# Patient Record
Sex: Female | Born: 1965 | Race: White | Hispanic: No | Marital: Married | Smoking: Former smoker
Health system: Southern US, Community
[De-identification: ages and names within clinical notes are randomized; demographics above are authoritative.]

## PROBLEM LIST (undated history)

## (undated) DIAGNOSIS — E785 Hyperlipidemia, unspecified: Secondary | ICD-10-CM

## (undated) DIAGNOSIS — K219 Gastro-esophageal reflux disease without esophagitis: Secondary | ICD-10-CM

## (undated) DIAGNOSIS — I219 Acute myocardial infarction, unspecified: Secondary | ICD-10-CM

## (undated) DIAGNOSIS — I2510 Atherosclerotic heart disease of native coronary artery without angina pectoris: Secondary | ICD-10-CM

## (undated) DIAGNOSIS — R202 Paresthesia of skin: Secondary | ICD-10-CM

## (undated) DIAGNOSIS — R200 Anesthesia of skin: Secondary | ICD-10-CM

## (undated) DIAGNOSIS — M1990 Unspecified osteoarthritis, unspecified site: Secondary | ICD-10-CM

## (undated) DIAGNOSIS — T7840XA Allergy, unspecified, initial encounter: Secondary | ICD-10-CM

## (undated) DIAGNOSIS — K5792 Diverticulitis of intestine, part unspecified, without perforation or abscess without bleeding: Secondary | ICD-10-CM

## (undated) HISTORY — DX: Unspecified osteoarthritis, unspecified site: M19.90

## (undated) HISTORY — PX: CARDIAC CATHETERIZATION: SHX172

## (undated) HISTORY — DX: Hyperlipidemia, unspecified: E78.5

## (undated) HISTORY — PX: CERVICAL DISCECTOMY: SHX98

## (undated) HISTORY — DX: Gastro-esophageal reflux disease without esophagitis: K21.9

## (undated) HISTORY — DX: Allergy, unspecified, initial encounter: T78.40XA

## (undated) HISTORY — PX: FRACTURE SURGERY: SHX138

## (undated) HISTORY — DX: Paresthesia of skin: R20.2

## (undated) HISTORY — PX: MOLE REMOVAL: SHX2046

## (undated) HISTORY — DX: Anesthesia of skin: R20.0

## (undated) HISTORY — DX: Atherosclerotic heart disease of native coronary artery without angina pectoris: I25.10

## (undated) HISTORY — PX: OTHER SURGICAL HISTORY: SHX169

## (undated) HISTORY — DX: Diverticulitis of intestine, part unspecified, without perforation or abscess without bleeding: K57.92

## (undated) HISTORY — PX: SPINE SURGERY: SHX786

## (undated) HISTORY — PX: JOINT REPLACEMENT: SHX530

---
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
## 1997-10-12 HISTORY — PX: TUBAL LIGATION: SHX77

## 1997-10-12 HISTORY — PX: CHOLECYSTECTOMY: SHX55

## 2000-03-07 ENCOUNTER — Emergency Department (HOSPITAL_COMMUNITY): Admission: EM | Admit: 2000-03-07 | Discharge: 2000-03-08 | Payer: Self-pay | Admitting: Emergency Medicine

## 2000-08-16 ENCOUNTER — Encounter: Admission: RE | Admit: 2000-08-16 | Discharge: 2000-08-16 | Payer: Self-pay | Admitting: Family Medicine

## 2000-08-16 ENCOUNTER — Encounter: Payer: Self-pay | Admitting: Family Medicine

## 2007-08-29 ENCOUNTER — Encounter: Admission: RE | Admit: 2007-08-29 | Discharge: 2007-08-29 | Payer: Self-pay | Admitting: Gastroenterology

## 2010-02-22 ENCOUNTER — Encounter: Payer: Self-pay | Admitting: Cardiovascular Disease

## 2010-02-22 ENCOUNTER — Ambulatory Visit: Payer: Self-pay | Admitting: Internal Medicine

## 2010-02-22 ENCOUNTER — Inpatient Hospital Stay (HOSPITAL_COMMUNITY): Admission: EM | Admit: 2010-02-22 | Discharge: 2010-02-25 | Payer: Self-pay | Admitting: Internal Medicine

## 2010-02-22 ENCOUNTER — Encounter: Payer: Self-pay | Admitting: Emergency Medicine

## 2010-02-26 ENCOUNTER — Telehealth: Payer: Self-pay | Admitting: Cardiovascular Disease

## 2010-03-11 ENCOUNTER — Ambulatory Visit: Payer: Self-pay | Admitting: Cardiovascular Disease

## 2010-03-11 DIAGNOSIS — I214 Non-ST elevation (NSTEMI) myocardial infarction: Secondary | ICD-10-CM

## 2010-03-11 DIAGNOSIS — E7800 Pure hypercholesterolemia, unspecified: Secondary | ICD-10-CM

## 2010-03-19 ENCOUNTER — Encounter: Payer: Self-pay | Admitting: Cardiovascular Disease

## 2010-03-20 ENCOUNTER — Encounter (HOSPITAL_COMMUNITY): Admission: RE | Admit: 2010-03-20 | Discharge: 2010-06-12 | Payer: Self-pay | Admitting: Cardiovascular Disease

## 2010-03-25 ENCOUNTER — Encounter: Payer: Self-pay | Admitting: Cardiovascular Disease

## 2010-04-04 ENCOUNTER — Ambulatory Visit: Payer: Self-pay | Admitting: Cardiovascular Disease

## 2010-04-06 LAB — CONVERTED CEMR LAB
ALT: 22 units/L (ref 0–35)
AST: 27 units/L (ref 0–37)
Albumin: 3.8 g/dL (ref 3.5–5.2)
Alkaline Phosphatase: 72 units/L (ref 39–117)
BUN: 6 mg/dL (ref 6–23)
Basophils Absolute: 0.1 10*3/uL (ref 0.0–0.1)
Basophils Relative: 0.6 % (ref 0.0–3.0)
Bilirubin, Direct: 0.2 mg/dL (ref 0.0–0.3)
CO2: 29 meq/L (ref 19–32)
Calcium: 9.1 mg/dL (ref 8.4–10.5)
Chloride: 107 meq/L (ref 96–112)
Cholesterol: 125 mg/dL (ref 0–200)
Creatinine, Ser: 0.6 mg/dL (ref 0.4–1.2)
Eosinophils Absolute: 0.1 10*3/uL (ref 0.0–0.7)
Eosinophils Relative: 0.6 % (ref 0.0–5.0)
GFR calc non Af Amer: 124.73 mL/min (ref >60)
Glucose, Bld: 85 mg/dL (ref 70–99)
HCT: 36.3 % (ref 36.0–46.0)
HDL: 43.8 mg/dL (ref >39.00)
Hemoglobin: 12.3 g/dL (ref 12.0–15.0)
LDL Cholesterol: 62 mg/dL (ref 0–99)
Lymphocytes Relative: 22.1 % (ref 12.0–46.0)
Lymphs Abs: 2 10*3/uL (ref 0.7–4.0)
MCHC: 33.8 g/dL (ref 30.0–36.0)
MCV: 91.3 fL (ref 78.0–100.0)
Monocytes Absolute: 0.7 10*3/uL (ref 0.1–1.0)
Monocytes Relative: 7.9 % (ref 3.0–12.0)
Neutro Abs: 6.2 10*3/uL (ref 1.4–7.7)
Neutrophils Relative %: 68.8 % (ref 43.0–77.0)
Platelets: 320 10*3/uL (ref 150.0–400.0)
Potassium: 4.4 meq/L (ref 3.5–5.1)
RBC: 3.98 M/uL (ref 3.87–5.11)
RDW: 12.9 % (ref 11.5–14.6)
Sodium: 140 meq/L (ref 135–145)
Total Bilirubin: 0.5 mg/dL (ref 0.3–1.2)
Total CHOL/HDL Ratio: 3
Total Protein: 6.7 g/dL (ref 6.0–8.3)
Triglycerides: 98 mg/dL (ref 0.0–149.0)
VLDL: 19.6 mg/dL (ref 0.0–40.0)
WBC: 9 10*3/uL (ref 4.5–10.5)

## 2010-04-11 ENCOUNTER — Ambulatory Visit: Payer: Self-pay | Admitting: Cardiovascular Disease

## 2010-04-29 ENCOUNTER — Telehealth: Payer: Self-pay | Admitting: Cardiovascular Disease

## 2010-06-02 ENCOUNTER — Encounter: Payer: Self-pay | Admitting: Cardiovascular Disease

## 2010-09-15 ENCOUNTER — Encounter
Admission: RE | Admit: 2010-09-15 | Discharge: 2010-09-15 | Payer: Self-pay | Source: Home / Self Care | Attending: Family Medicine | Admitting: Family Medicine

## 2010-10-01 ENCOUNTER — Ambulatory Visit: Payer: Self-pay | Admitting: Cardiovascular Disease

## 2010-10-01 DIAGNOSIS — I2510 Atherosclerotic heart disease of native coronary artery without angina pectoris: Secondary | ICD-10-CM

## 2010-11-11 NOTE — Assessment & Plan Note (Signed)
Summary: EPH   Visit Type:  Post-hospital Primary Provider:  none  CC:  No complains.  History of Present Illness: This is a 45 year-old woman presenting for hospital follow-up after presenting with a NSTEMI earlier this month. She underwent cardiac cath demonstrating critical prox LAD stenosis and was treated with a drug-eluting stent. There was a question of left main disease but IVUS demonstrated only mild atherosclerosis of the left mainstem.  She is doing well at present. Reports episodic, fleeting chest pains. These have occurred only at rest and are unlike the symptoms of her ACS. No dyspnea, edema, groin pain, or lightheadedness. She does complain of fatigue/drowsiness and wonders if the beta blocker is causing this. She is scheduled to start cardiac rehab June 8th.      Current Medications (verified): 1)  Fish Oil 1000 Mg Caps (Omega-3 Fatty Acids) .... Take 1 Capsule By Mouth Once A Day 2)  Tylenol 8 Hour 650 Mg Cr-Tabs (Acetaminophen) .... As Needed 3)  Metoprolol Tartrate 25 Mg Tabs (Metoprolol Tartrate) .... Take 1/2 Tablet Two Times A Day 4)  Lysine Acetate 500 Mg Tabs (Lysine Acetate) .... Take 1 Tablet By Mouth Once A Day 5)  Glucosamine 500 Mg Caps (Glucosamine Sulfate) .... Take 1 Capsule By Mouth Once A Day 6)  Aspirin Ec 325 Mg Tbec (Aspirin) .... Take One Tablet By Mouth Daily 7)  Nitrostat 0.4 Mg Subl (Nitroglycerin) .Marland Kitchen.. 1 Tablet Under Tongue At Onset of Chest Pain; You May Repeat Every 5 Minutes For Up To 3 Doses. 8)  Crestor 20 Mg Tabs (Rosuvastatin Calcium) .... Take One Tablet By Mouth Daily. 9)  Plavix 75 Mg Tabs (Clopidogrel Bisulfate) .... Take One Tablet By Mouth Daily  Allergies (verified): 1)  ! Penicillin 2)  ! Sulfa 3)  ! * Tape 4)  ! * Latex  Past History:  Past medical history reviewed for relevance to current acute and chronic problems.  Past Medical History:  Non-ST segment elevation myocardial  infarction 2011, DES placed in the LAD.    Marland KitchenHistory of diverticulitis.   Dyslipidemia   Gastroesophageal reflux disease.      Review of Systems       Negative except as per HPI   Vital Signs:  Patient profile:   45 year old female Height:      67 inches Weight:      172 pounds BMI:     27.04 Pulse rate:   65 / minute Pulse rhythm:   regular Resp:     18 per minute BP sitting:   118 / 72  (left arm) Cuff size:   large  Vitals Entered By: Vikki Ports (Mar 11, 2010 2:02 PM)  Physical Exam  General:  Pt is alert and oriented, age-apprpriate woman, in no acute distress. HEENT: normal Neck: normal carotid upstrokes without bruits, JVP normal Lungs: CTA CV: RRR without murmur or gallop Abd: soft, NT, positive BS, no bruit, no organomegaly Ext: no clubbing, cyanosis, or edema. peripheral pulses 2+ and equal Skin: warm and dry without rash    EKG  Procedure date:  03/11/2010  Findings:      NSR with T wave abnormality consider anterior ischemia, HR 65 bpm.  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 She is having no recurrent angina. Recommend continuation of her current medical program and initiation of Phase 2 cardiac rehab. She was counseled on side-effects of  beta blockers and other cardiac meds, and I asked her to stick with it until her follow-up visit in one month. Will reassess at that point and if fatigue is severe, will probably d/c the beta blocker to see if things improve.  Her updated medication list for this problem includes:    Metoprolol Tartrate 25 Mg Tabs (Metoprolol tartrate) .Marland Kitchen... Take 1/2 tablet two times a day    Aspirin Ec 325 Mg Tbec (Aspirin) .Marland Kitchen... Take one tablet by mouth daily    Nitrostat 0.4 Mg Subl (Nitroglycerin) .Marland Kitchen... 1 tablet under tongue at onset of chest pain; you may repeat every 5 minutes for up to 3 doses.    Plavix 75 Mg Tabs (Clopidogrel bisulfate) .Marland Kitchen... Take one tablet  by mouth daily  Orders: EKG w/ Interpretation (93000)  Problem # 2:  HYPERCHOLESTEROLEMIA (ICD-272.0) Started on rosuvastatin during index hospitalization. Follow-up lipids at time of next visit.  Her updated medication list for this problem includes:    Crestor 20 Mg Tabs (Rosuvastatin calcium) .Marland Kitchen... Take one tablet by mouth daily.  Orders: EKG w/ Interpretation (93000)  Patient Instructions: 1)  Your physician recommends that you schedule a follow-up appointment in: 1 MONTH 2)  Your physician recommends that you return for a FASTING LIPID, LIVER, BMP, and CBC (410.72, 272.0) in 1 MONTH--Nothing to eat or drink after midnight 3)  Your physician recommends that you continue on your current medications as directed. Please refer to the Current Medication list given to you today.

## 2010-11-11 NOTE — Assessment & Plan Note (Signed)
Summary: F1M   Visit Type:  1 month follow up Primary Provider:  none  CC:  No complaints.  History of Present Illness: This is a 45 year-old woman presenting for follow-up after presenting with a NSTEMI in May 2011. She underwent cardiac cath demonstrating critical prox LAD stenosis and was treated with a drug-eluting stent. The patient is doing very well. She is participating in cardiac rehab and riding her bike every day. She feels well and has no exertional chest pain or dyspnea. No edema or other complaints. She has adjusted to the beta-blocker and feels energy level is better.     Current Medications (verified): 1)  Fish Oil 1000 Mg Caps (Omega-3 Fatty Acids) .... Take 1 Capsule By Mouth Once A Day 2)  Tylenol 8 Hour 650 Mg Cr-Tabs (Acetaminophen) .... As Needed 3)  Metoprolol Tartrate 25 Mg Tabs (Metoprolol Tartrate) .... Take 1/2 Tablet Two Times A Day 4)  Lysine Acetate 500 Mg Tabs (Lysine Acetate) .... Take 1 Tablet By Mouth Once A Day 5)  Glucosamine 500 Mg Caps (Glucosamine Sulfate) .... Take 1 Capsule By Mouth Once A Day 6)  Aspirin Ec 325 Mg Tbec (Aspirin) .... Take One Tablet By Mouth Daily 7)  Nitrostat 0.4 Mg Subl (Nitroglycerin) .Marland Kitchen.. 1 Tablet Under Tongue At Onset of Chest Pain; You May Repeat Every 5 Minutes For Up To 3 Doses. 8)  Crestor 20 Mg Tabs (Rosuvastatin Calcium) .... Take One Tablet By Mouth Daily. 9)  Plavix 75 Mg Tabs (Clopidogrel Bisulfate) .... Take One Tablet By Mouth Daily  Allergies: 1)  ! Penicillin 2)  ! Sulfa 3)  ! * Tape 4)  ! * Latex  Past History:  Past medical history reviewed for relevance to current acute and chronic problems.  Past Medical History: Reviewed history from 03/11/2010 and no changes required.  Non-ST segment elevation myocardial  infarction 2011, DES placed in the LAD.  Marland KitchenHistory of diverticulitis.   Dyslipidemia   Gastroesophageal reflux disease.      Vital Signs:  Patient profile:   45 year old female Height:       67 inches Weight:      171 pounds BMI:     26.88 Pulse rate:   60 / minute Pulse rhythm:   regular Resp:     18 per minute BP sitting:   100 / 70  (left arm) Cuff size:   large  Vitals Entered By: Vikki Ports (April 11, 2010 9:49 AM)  Physical Exam  General:  Pt is alert and oriented, in no acute distress. HEENT: normal Neck: normal carotid upstrokes without bruits, JVP normal Lungs: CTA CV: RRR without murmur or gallop Abd: soft, NT, positive BS, no bruit, no organomegaly Ext: no clubbing, cyanosis, or edema. 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 I asked her to reduce her ASA to 81 mg daily beginning in mid-August (3 months from stenting). She should continue with long-term plavix for at least one year. Otherwise continue current med Rx.  Her updated medication list for this problem includes:    Metoprolol Tartrate 25 Mg Tabs (Metoprolol tartrate) .Marland Kitchen... Take 1/2 tablet two times a day    Aspirin Ec 325 Mg Tbec (Aspirin) .Marland Kitchen... Take one tablet by mouth daily    Nitrostat 0.4 Mg Subl (Nitroglycerin) .Marland Kitchen... 1 tablet under tongue  at onset of chest pain; you may repeat every 5 minutes for up to 3 doses.    Plavix 75 Mg Tabs (Clopidogrel bisulfate) .Marland Kitchen... Take one tablet by mouth daily  Problem # 2:  HYPERCHOLESTEROLEMIA (ICD-272.0) Lipids at goal. Will f/u lipids and lft's in 6 months.  Her updated medication list for this problem includes:    Crestor 20 Mg Tabs (Rosuvastatin calcium) .Marland Kitchen... Take one tablet by mouth daily.  CHOL: 125 (04/04/2010)   LDL: 62 (04/04/2010)   HDL: 43.80 (04/04/2010)   TG: 98.0 (04/04/2010)  Patient Instructions: 1)  Your physician recommends that you continue on your current medications as directed. Please refer to the Current Medication list given to you today. 2)  Your physician wants you  to follow-up in: November.  You will receive a reminder letter in the mail two months in advance. If you don't receive a letter, please call our office to schedule the follow-up appointment. 3)  You may decrease your Aspirin to 81mg  once a day in mid-August.  4)  Your physician recommends that you return for a FASTING LIPID and LIVER Profile in December.

## 2010-11-11 NOTE — Letter (Signed)
[facility]   Imported By: Marylou Mccoy 06/13/2010 16:42:49  _____________________________________________________________________  External Attachment:    Type:   Image     Comment:   External Document

## 2010-11-11 NOTE — Miscellaneous (Signed)
Summary: [facility]  [facility]   Imported By: Roderic Ovens 04/02/2010 16:18:34  _____________________________________________________________________  External Attachment:    Type:   Image     Comment:   External Document

## 2010-11-11 NOTE — Progress Notes (Signed)
Summary: pt would like to graduate from cardiac rehab  Phone Note Call from Patient   Caller: Patient Reason for Call: Talk to Nurse Summary of Call: pt would like to graduate from cardiac rehab  on 7-29 if at all posssible-pls call [phone] Initial call taken by: Glynda Jaeger,  April 29, 2010 2:42 PM  Follow-up for Phone Call        Spoke with pt. Patient would like to know if she can be d/c from cardiac rehab. on 05/09/10. Pt. states she is doing well. I let pt. know. I will send this message to  Dr. Excell Seltzer. He  will be in office sometime next week. Okay with pt.   Follow-up by: Ollen Gross, RN, BSN,  April 29, 2010 2:48 PM  Additional Follow-up for Phone Call Additional follow up Details #1::        this is fine Additional Follow-up by: Norva Karvonen, MD,  April 30, 2010 11:13 PM     Appended Document: pt would like to graduate from cardiac rehab Called patient and LM that it is OK with Dr.Chrisie Jankovich that she DC Cardiac Rehab. Called [facility] and advised as above and they can send [place] an order to sign if they need [facility] to do this.

## 2010-11-11 NOTE — Miscellaneous (Signed)
Summary: [facility]  [facility]   Imported By: Roderic Ovens 04/05/2010 09:57:52  _____________________________________________________________________  External Attachment:    Type:   Image     Comment:   External Document

## 2010-11-11 NOTE — Miscellaneous (Signed)
Summary: [facility]   [facility]   Imported By: Roderic Ovens 04/22/2010 15:07:07  _____________________________________________________________________  External Attachment:    Type:   Image     Comment:   External Document

## 2010-11-11 NOTE — Progress Notes (Signed)
Summary: Hep B Injection  Phone Note Call from Patient Call back at Home Phone [phone]   Caller: Patient Reason for Call: Talk to Nurse Summary of Call: pt due for last Hep B shot is this ok?  had stent placed on Monday Initial call taken by: Migdalia Dk,  Feb 26, 2010 4:05 PM  Follow-up for Phone Call        I spoke with the pt and told her that this vaccine should not interfere with her stent.  The pt asked if the vaccine would interfere with her medications.  I told the pt that this is unknown because research has not been done on how vaccine interferes with meds.  The pt is scheduled for vaccine on Friday at Urgent Care. Follow-up by: Julieta Gutting, RN, BSN,  Feb 26, 2010 4:15 PM

## 2010-11-11 NOTE — Miscellaneous (Signed)
Summary: [facility]  [facility]   Imported By: Roderic Ovens 04/08/2010 16:21:01  _____________________________________________________________________  External Attachment:    Type:   Image     Comment:   External Document

## 2010-11-13 NOTE — Assessment & Plan Note (Signed)
Summary: f51m   Visit Type:  Follow-up Primary Provider:  [facility]  CC:  none.  History of Present Illness: This is a 45 year-old woman presenting for follow-up after presenting with a NSTEMI in May 2011. She underwent cardiac cath demonstrating critical prox LAD stenosis and was treated with a drug-eluting stent.   She continues to do well without complaints of chest pain, dyspnea, or edema. No lightheadedness, palps, or syncope. She has not been exercising regularly. She reports compliance with her medications.     Current Medications (verified): 1)  Fish Oil 1000 Mg Caps (Omega-3 Fatty Acids) .... Take 1 Capsule By Mouth Once A Day 2)  Tylenol 8 Hour 650 Mg Cr-Tabs (Acetaminophen) .... As Needed 3)  Metoprolol Tartrate 25 Mg Tabs (Metoprolol Tartrate) .... Take 1/2 Tablet Two Times A Day 4)  Lysine Acetate 500 Mg Tabs (Lysine Acetate) .... Take 1 Tablet By Mouth Once A Day 5)  Glucosamine 500 Mg Caps (Glucosamine Sulfate) .... Take 1 Capsule By Mouth Once A Day 6)  Aspirin 81 Mg  Tabs (Aspirin) .Marland Kitchen.. 1 By Mouth Daily 7)  Nitrostat 0.4 Mg Subl (Nitroglycerin) .Marland Kitchen.. 1 Tablet Under Tongue At Onset of Chest Pain; You May Repeat Every 5 Minutes For Up To 3 Doses. 8)  Crestor 20 Mg Tabs (Rosuvastatin Calcium) .... Take One Tablet By Mouth Daily. 9)  Plavix 75 Mg Tabs (Clopidogrel Bisulfate) .... Take One Tablet By Mouth Daily 10)  Multivitamins   Tabs (Multiple Vitamin) .Marland Kitchen.. 1 By Mouth Daily 11)  Omeprazole 20 Mg Cpdr (Omeprazole) .Marland Kitchen.. 1 By Mouth Daily  Allergies (verified): 1)  ! Penicillin 2)  ! Sulfa 3)  ! * Tape 4)  ! * Latex  Past History:  Past medical history reviewed for relevance to current acute and chronic problems.  Past Medical History: Reviewed history from 03/11/2010 and no changes required.  Non-ST segment elevation myocardial  infarction 2011, DES placed in the LAD.  Marland KitchenHistory of diverticulitis.   Dyslipidemia   Gastroesophageal reflux disease.        Review of Systems       Negative except as per HPI   Vital Signs:  Patient profile:   45 year old female Height:      67 inches Weight:      178 pounds BMI:     27.98 Pulse rate:   66 / minute Resp:     16 per minute BP sitting:   110 / 76  (right arm)  Vitals Entered By: Marrion Coy, CNA (October 01, 2010 1:52 PM)  Physical Exam  General:  Pt is alert and oriented, in no acute distress. HEENT: normal Neck: normal carotid upstrokes without bruits, JVP normal Lungs: CTA CV: RRR without murmur or gallop Abd: soft, NT, positive BS, no bruit, no organomegaly Ext: no clubbing, cyanosis, or edema. 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 We discussed the importance of reinitiateing efforts at a regular exercise program. She should continue DAPT with ASA and plavix for a minimus of 12 months from the time of her MI. Continue secondary risk reduction measures as outlined below. Will do an exercise treadmill stress test in 6 months.  Her updated medication list for this problem  includes:    Metoprolol Tartrate 25 Mg Tabs (Metoprolol tartrate) .Marland Kitchen... Take 1/2 tablet two times a day    Aspirin 81 Mg Tabs (Aspirin) .Marland Kitchen... 1 by mouth daily    Nitrostat 0.4 Mg Subl (Nitroglycerin) .Marland Kitchen... 1 tablet under tongue at onset of chest pain; you may repeat every 5 minutes for up to 3 doses.    Plavix 75 Mg Tabs (Clopidogrel bisulfate) .Marland Kitchen... Take one tablet by mouth daily  Problem # 2:  HYPERCHOLESTEROLEMIA (ICD-272.0) LFT's were checked by her PCP last month and were reviewed today...they were within normal limits. Will repeat lipids and lft's in 6 months.  Her updated medication list for this problem includes:    Crestor 20 Mg Tabs (Rosuvastatin calcium) .Marland Kitchen...  Take one tablet by mouth daily.  CHOL: 125 (04/04/2010)   LDL: 62 (04/04/2010)   HDL: 43.80 (04/04/2010)   TG: 98.0 (04/04/2010)  Patient Instructions: 1)  Your physician recommends that you return for a FASTING LIPID and LIVER Profile in 6 MONTHS (272.0) 2)  Your physician has recommended you make the following change in your medication: STOP Omeprazole, START Protonix 40mg  once a day 3)  Your physician wants you to follow-up in: 6 MONTHS.  You will receive a reminder letter in the mail two months in advance. If you don't receive a letter, please call our office to schedule the follow-up appointment. 4)  Your physician has requested that you have an exercise tolerance test in 6 MONTHS.  For further information please visit [email or web address]  Please also follow instruction sheet, as given. Prescriptions: PANTOPRAZOLE SODIUM 40 MG TBEC (PANTOPRAZOLE SODIUM) take one tablet by mouth daily  #30 x 11   Entered by:   Julieta Gutting, RN, BSN   Authorized by:   Norva Karvonen, MD   Signed by:   Julieta Gutting, RN, BSN on 10/01/2010   Method used:   Electronically to        [facility] [address] (retail)       [address]       [place], [place]  [address]       Ph: [phone]       Fax: [phone]   RxID:   [id number]

## 2010-12-29 LAB — LIPID PANEL
Cholesterol: 180 mg/dL (ref 0–200)
HDL: 37 mg/dL — ABNORMAL LOW (ref >39)
LDL Cholesterol: 117 mg/dL — ABNORMAL HIGH (ref 0–99)
Total CHOL/HDL Ratio: 4.9 RATIO
Triglycerides: 130 mg/dL (ref <150)
VLDL: 26 mg/dL (ref 0–40)

## 2010-12-29 LAB — CBC
Hemoglobin: 12 g/dL (ref 12.0–15.0)
MCHC: 33.9 g/dL (ref 30.0–36.0)
MCV: 90.9 fL (ref 78.0–100.0)
RBC: 3.73 MIL/uL — ABNORMAL LOW (ref 3.87–5.11)
RBC: 3.83 MIL/uL — ABNORMAL LOW (ref 3.87–5.11)
RBC: 4.31 MIL/uL (ref 3.87–5.11)
RDW: 13.2 % (ref 11.5–15.5)
WBC: 10 10*3/uL (ref 4.0–10.5)
WBC: 8.9 10*3/uL (ref 4.0–10.5)

## 2010-12-29 LAB — BASIC METABOLIC PANEL
Calcium: 8.2 mg/dL — ABNORMAL LOW (ref 8.4–10.5)
Chloride: 109 mEq/L (ref 96–112)
Creatinine, Ser: 0.61 mg/dL (ref 0.4–1.2)
GFR calc Af Amer: >60 mL/min (ref >60)

## 2010-12-29 LAB — CARDIAC PANEL(CRET KIN+CKTOT+MB+TROPI)
CK, MB: 1.5 ng/mL (ref 0.3–4.0)
Total CK: 73 U/L (ref 7–177)

## 2010-12-29 LAB — PLATELET INHIBITION P2Y12

## 2010-12-29 LAB — HEPARIN LEVEL (UNFRACTIONATED)
Heparin Unfractionated: 0.33 IU/mL (ref 0.30–0.70)
Heparin Unfractionated: 0.62 IU/mL (ref 0.30–0.70)

## 2010-12-30 LAB — APTT: aPTT: 31 seconds (ref 24–37)

## 2010-12-30 LAB — DIFFERENTIAL
Basophils Absolute: 0.1 10*3/uL (ref 0.0–0.1)
Lymphocytes Relative: 29 % (ref 12–46)
Monocytes Absolute: 0.4 10*3/uL (ref 0.1–1.0)
Monocytes Relative: 7 % (ref 3–12)
Neutro Abs: 4 10*3/uL (ref 1.7–7.7)

## 2010-12-30 LAB — BASIC METABOLIC PANEL
Calcium: 8.8 mg/dL (ref 8.4–10.5)
GFR calc Af Amer: >60 mL/min (ref >60)
GFR calc non Af Amer: >60 mL/min (ref >60)
Sodium: 140 mEq/L (ref 135–145)

## 2010-12-30 LAB — POCT CARDIAC MARKERS
Troponin i, poc: 0.07 ng/mL (ref 0.00–0.09)
Troponin i, poc: <0.05 ng/mL (ref 0.00–0.09)

## 2010-12-30 LAB — CBC
Hemoglobin: 13.1 g/dL (ref 12.0–15.0)
MCHC: 33.8 g/dL (ref 30.0–36.0)
MCV: 92.7 fL (ref 78.0–100.0)
Platelets: 275 10*3/uL (ref 150–400)
RBC: 4.36 MIL/uL (ref 3.87–5.11)
RDW: 12.9 % (ref 11.5–15.5)
RDW: 13.1 % (ref 11.5–15.5)
WBC: 9.3 10*3/uL (ref 4.0–10.5)

## 2010-12-30 LAB — HEPARIN LEVEL (UNFRACTIONATED): Heparin Unfractionated: 0.72 IU/mL — ABNORMAL HIGH (ref 0.30–0.70)

## 2010-12-30 LAB — CARDIAC PANEL(CRET KIN+CKTOT+MB+TROPI)
Relative Index: INVALID (ref 0.0–2.5)
Total CK: 73 U/L (ref 7–177)
Troponin I: 0.36 ng/mL — ABNORMAL HIGH (ref 0.00–0.06)

## 2011-02-15 ENCOUNTER — Other Ambulatory Visit: Payer: Self-pay | Admitting: Cardiovascular Disease

## 2011-03-25 ENCOUNTER — Other Ambulatory Visit: Payer: Self-pay | Admitting: Cardiovascular Disease

## 2011-03-26 ENCOUNTER — Other Ambulatory Visit: Payer: Self-pay | Admitting: *Deleted

## 2011-03-26 MED ORDER — METOPROLOL TARTRATE 25 MG PO TABS
ORAL_TABLET | ORAL | Status: DC
Start: 2011-03-26 — End: 2012-03-10

## 2011-04-03 ENCOUNTER — Ambulatory Visit: Payer: Self-pay | Admitting: Cardiovascular Disease

## 2011-04-06 ENCOUNTER — Other Ambulatory Visit (INDEPENDENT_AMBULATORY_CARE_PROVIDER_SITE_OTHER): Payer: PRIVATE HEALTH INSURANCE | Admitting: *Deleted

## 2011-04-06 DIAGNOSIS — E7800 Pure hypercholesterolemia, unspecified: Secondary | ICD-10-CM

## 2011-04-06 LAB — HEPATIC FUNCTION PANEL
ALT: 33 U/L (ref 0–35)
AST: 31 U/L (ref 0–37)
Alkaline Phosphatase: 64 U/L (ref 39–117)
Bilirubin, Direct: 0.1 mg/dL (ref 0.0–0.3)
Total Bilirubin: 0.4 mg/dL (ref 0.3–1.2)

## 2011-04-06 LAB — LIPID PANEL: HDL: 46.8 mg/dL (ref >39.00)

## 2011-04-08 ENCOUNTER — Telehealth: Payer: Self-pay | Admitting: *Deleted

## 2011-04-08 NOTE — Telephone Encounter (Signed)
LM on identified voice mail that lab work is good--continue present medication therpy--nt

## 2011-04-08 NOTE — Telephone Encounter (Signed)
LM on identified voicemail--labs good, continue present med regime--nt

## 2011-04-13 ENCOUNTER — Encounter: Payer: Self-pay | Admitting: Cardiovascular Disease

## 2011-04-14 ENCOUNTER — Ambulatory Visit (INDEPENDENT_AMBULATORY_CARE_PROVIDER_SITE_OTHER): Payer: PRIVATE HEALTH INSURANCE | Admitting: Cardiovascular Disease

## 2011-04-14 ENCOUNTER — Other Ambulatory Visit: Payer: Self-pay | Admitting: *Deleted

## 2011-04-14 DIAGNOSIS — I2510 Atherosclerotic heart disease of native coronary artery without angina pectoris: Secondary | ICD-10-CM

## 2011-04-14 MED ORDER — CLOPIDOGREL BISULFATE 75 MG PO TABS: 75.0000 mg | ORAL_TABLET | Freq: Every day | ORAL | Status: DC

## 2011-04-14 MED ORDER — NITROGLYCERIN 0.4 MG SL SUBL: 0.4000 mg | SUBLINGUAL_TABLET | SUBLINGUAL | Status: DC | PRN

## 2011-04-14 MED ORDER — METOPROLOL TARTRATE 25 MG PO TABS: ORAL_TABLET | ORAL | Status: DC

## 2011-04-14 MED ORDER — ROSUVASTATIN CALCIUM 20 MG PO TABS: 20.0000 mg | ORAL_TABLET | Freq: Every day | ORAL | Status: DC

## 2011-04-14 NOTE — Progress Notes (Signed)
Exercise Treadmill Test  Pre-Exercise Testing Evaluation Rhythm: normal sinus  Rate: 67   PR:  .14 QRS:  .08  QT:  .41 QTc: .43     Test  Exercise Tolerance Test Ordering MD: [name], MD  Interpreting MD:  [name], MD  Unique Test No: 1  Treadmill:  1  Indication for ETT: CAD  Contraindication to ETT: No   Stress Modality: exercise - treadmill  Cardiac Imaging Performed: non   Protocol: standard Bruce - maximal  Max BP:163/82  Max MPHR (bpm):  175 85% MPR (bpm):  145  MPHR obtained (bpm):  150 % MPHR obtained:  86  Reached 85% MPHR (min:sec):  9:45 Total Exercise Time (min-sec):  10:22  Workload in METS:  12.3 Borg Scale: 19  Reason ETT Terminated:  leg cramps    ST Segment Analysis At Rest: normal ST segments - no evidence of significant ST depression With Exercise: significant ischemic ST depression  Other Information Arrhythmia:  Yes Angina during ETT:  absent (0) Quality of ETT:  diagnostic  ETT Interpretation:  abnormal - evidence of ST depression consistent with ischemia  Comments: Abnormal exercise treadmill study with significant horizontal ST segment depression, initially resolves in early recovery then ST changes return. No angina and good exercise capacity. Frequent PVC's in early exercise, sinus rhythm with rare PVC's in stage 3/4.  Recommendations: Pt is having symptoms similar to her prior angina, not as bad in severity. Considering treadmill EKG results, I have recommended repeat cardiac cath. She would like to discuss with her husband. She will call back to schedule. Advised to seek immediate medical care for resting chest pain or progressive symptoms.

## 2011-04-17 ENCOUNTER — Encounter: Payer: Self-pay | Admitting: *Deleted

## 2011-04-17 ENCOUNTER — Telehealth: Payer: Self-pay | Admitting: Cardiovascular Disease

## 2011-04-17 NOTE — Telephone Encounter (Signed)
Pt calling to let us know she wants to go ahead and sched. Cath--pt knows dr cooper not here next week--spoke with pt and advised that i will make dr cooper and his nurse, pat, aware that she wants to proceed--pt agrees--nt

## 2011-04-17 NOTE — Telephone Encounter (Signed)
We talked about doing her cath Friday 7/20...did that work for her?

## 2011-04-17 NOTE — Telephone Encounter (Signed)
PT CALLING TO SCHEDULE CATH

## 2011-04-21 ENCOUNTER — Encounter: Payer: Self-pay | Admitting: *Deleted

## 2011-04-22 NOTE — Telephone Encounter (Signed)
Pt aware of upcoming c cath scheduled for 7/20/ with dr Levi Aland come in for labs 04/24/11--nt

## 2011-04-24 ENCOUNTER — Other Ambulatory Visit (INDEPENDENT_AMBULATORY_CARE_PROVIDER_SITE_OTHER): Payer: PRIVATE HEALTH INSURANCE | Admitting: *Deleted

## 2011-04-24 DIAGNOSIS — E7800 Pure hypercholesterolemia, unspecified: Secondary | ICD-10-CM

## 2011-04-24 DIAGNOSIS — I214 Non-ST elevation (NSTEMI) myocardial infarction: Secondary | ICD-10-CM

## 2011-04-24 DIAGNOSIS — I2510 Atherosclerotic heart disease of native coronary artery without angina pectoris: Secondary | ICD-10-CM

## 2011-04-24 LAB — CBC
HCT: 37.6 % (ref 36.0–46.0)
Hemoglobin: 12.9 g/dL (ref 12.0–15.0)
MCH: 30.3 pg (ref 26.0–34.0)
MCHC: 34.3 g/dL (ref 30.0–36.0)
MCV: 88.3 fL (ref 78.0–100.0)

## 2011-04-24 LAB — BASIC METABOLIC PANEL WITH GFR
BUN: 6 mg/dL (ref 6–23)
CO2: 23 mEq/L (ref 19–32)
Chloride: 102 mEq/L (ref 96–112)
Creat: 0.58 mg/dL (ref 0.50–1.10)
GFR, Est Non African American: >60 mL/min (ref >60)
Glucose, Bld: 104 mg/dL — ABNORMAL HIGH (ref 70–99)

## 2011-04-24 LAB — PROTIME-INR: Prothrombin Time: 13.3 seconds (ref 11.6–15.2)

## 2011-05-01 ENCOUNTER — Ambulatory Visit (HOSPITAL_COMMUNITY)
Admission: RE | Admit: 2011-05-01 | Discharge: 2011-05-02 | Disposition: A | Discharge status: Home / Self Care | Payer: PRIVATE HEALTH INSURANCE | Source: Ambulatory Visit | Attending: Cardiovascular Disease | Admitting: Cardiovascular Disease

## 2011-05-01 DIAGNOSIS — I2510 Atherosclerotic heart disease of native coronary artery without angina pectoris: Secondary | ICD-10-CM

## 2011-05-01 DIAGNOSIS — Z79899 Other long term (current) drug therapy: Secondary | ICD-10-CM | POA: Insufficient documentation

## 2011-05-01 DIAGNOSIS — Z7982 Long term (current) use of aspirin: Secondary | ICD-10-CM | POA: Insufficient documentation

## 2011-05-01 DIAGNOSIS — E785 Hyperlipidemia, unspecified: Secondary | ICD-10-CM | POA: Insufficient documentation

## 2011-05-01 DIAGNOSIS — K219 Gastro-esophageal reflux disease without esophagitis: Secondary | ICD-10-CM | POA: Insufficient documentation

## 2011-05-01 DIAGNOSIS — I4949 Other premature depolarization: Secondary | ICD-10-CM | POA: Insufficient documentation

## 2011-05-02 DIAGNOSIS — I200 Unstable angina: Secondary | ICD-10-CM

## 2011-05-02 LAB — CBC
HCT: 39.9 % (ref 36.0–46.0)
Hemoglobin: 13.9 g/dL (ref 12.0–15.0)
MCV: 89.1 fL (ref 78.0–100.0)
RBC: 4.48 MIL/uL (ref 3.87–5.11)
WBC: 10.9 10*3/uL — ABNORMAL HIGH (ref 4.0–10.5)

## 2011-05-02 LAB — BASIC METABOLIC PANEL
CO2: 23 mEq/L (ref 19–32)
Chloride: 102 mEq/L (ref 96–112)
Potassium: 4.4 mEq/L (ref 3.5–5.1)
Sodium: 137 mEq/L (ref 135–145)

## 2011-05-02 LAB — PLATELET INHIBITION P2Y12: P2Y12 % Inhibition: 0 %

## 2011-05-02 NOTE — Discharge Summary (Addendum)
NAMEJERZEE, Dawn Washington NO.:  [id number]  MEDICAL RECORD NO.:  [id number]  LOCATION:  6525                         FACILITY:  [facility]  PHYSICIAN:  Vesta Mixer, M.D. DATE OF BIRTH:  04/16/1966  DATE OF ADMISSION:  05/01/2011 DATE OF DISCHARGE:  05/02/2011                              DISCHARGE SUMMARY   DISCHARGE DIAGNOSES: 1. Unstable angina. 2. Coronary artery disease.     a.     Status post drug-eluting stent to the distal right coronary      artery this admission, May 01, 2011 with patent left anterior      descending stent, and otherwise mild-to-moderate diffuse coronary      artery disease.     b.     Status post non-ST-elevation myocardial infarction in 2011      and drug-eluting stent placement to the left anterior descending.\     c.     EF of 50% by cath 2011.     d.     P2Y12 testing this admission showing inadequate platelet inhibition with      Plavix, changed to Brilinta. 3. Dyslipidemia. 4. Gastroesophageal reflux disease. 5. Premature ventricular contractions.  HOSPITAL COURSE:  Dawn Washington is a very pleasant 45 year old female with a history of known coronary artery disease and dyslipidemia who presented to Dr. Earmon Phoenix office for an exercise treadmill stress test for 1-year ischemic surveillance study following her initial event, which she presented for her treadmill test.  On July 3, the patient admitted to recurrent exertional chest and jaw tightness.  Her treadmill study showed good exercise tolerance and she was able to exercise for 10 minutes and 22 seconds according to the Bruce protocol.  The patient did not have angina during exercise, but did have significant horizontal ST- segment depression which initially resolved early in recovery but then returned again.  She had frequent PVCs throughout exercise.  Given these findings, Dr. Excell Seltzer recommended cardiac catheterization.  The patient was brought into the hospital for this  procedure, May 01, 2011, which demonstrated severe distal RCA stenosis to which Dr. Excell Seltzer performed drug-eluting stent placement.  She had a widely patent proximal LAD stent, and otherwise mild-to-moderate diffuse CAD.  P2Y12 testing was undertaken and demonstrated a PRU of 278 with 0% inhibition. Thus, the patient was loaded with Brilinta and discharged with a prescription for 90mg  bid and told to discontinue her Plavix. Dr. Elease Hashimoto has seen and examined the patient today and feels she is stable for discharge.  DISCHARGE LABS:  WBC 10.9, HGB 13.9, HCT 39.9, PLT 286. Na 137, K 4.4, Cl 102, CO2 23, glu 93, BUN 8, Cr 0.58. P2Y12 testing showed PRU of 278, % baseline of 226, % inhibition of 0.  STUDIES:  Cardiac catheterization, May 01, 2011, please see full report for details as well as HPI for summary.  DISCHARGE MEDICATIONS: 1. Crestor 40 mg nightly. 2. Aspirin 81 mg daily. 3. Tylenol 325 mg 2 tablets q.4 h p.r.n. pain. 4. Brilinta 90mg  bid. (Please note Plavix was discontinued secondary to inadequate platelet inhibition by P2Y12 testing.) 5. Fish oil 1 capsule daily. 6. Lysine 1 tablet daily. 7. Metoprolol tartrate 25  mg half tablet b.i.d. 8. Multivitamin 1 tablet daily. 9. Nitroglycerin sublingual 0.4 mg every 5 minutes as needed up to 3     doses for chest pain.  DISPOSITION:  Dawn Washington will be discharged in stable condition to home. She is not to lift anything over 5 pounds for 1 week, drive for 2 days or participate in sexual activity for 1 week.  She is planning to work with Dr. Excell Seltzer and was told she can go back on May 04, 2011.  She is to follow a low-sodium, heart-healthy diet, to call or return if she notices any pain, swelling, bleeding or pus at cath site.  Dr. Earmon Phoenix office will call her with that appointment.    DURATION OF DISCHARGE ENCOUNTER:  Greater than 30 minutes including physician and PA time.     Ronie Spies,  P.A.C.   ______________________________ Vesta Mixer, M.D.    DD/MEDQ  D:  05/02/2011  T:  05/02/2011  Job:  [id number]  cc:   Veverly Fells. Excell Seltzer, MD  Electronically Signed by Ronie Spies  on 05/02/2011 02:20:48 PM Electronically Signed by Kristeen Miss M.D. on 05/11/2011 12:10:54 PM

## 2011-05-26 NOTE — Cardiovascular Report (Signed)
Dawn Washington, Dawn Washington NO.:  [id number]  MEDICAL RECORD NO.:  [id number]  LOCATION:  6525                         FACILITY:  [facility]  PHYSICIAN:  Veverly Fells. Excell Seltzer, MD  DATE OF BIRTH:  1966/03/29  DATE OF PROCEDURE: DATE OF DISCHARGE:                           CARDIAC CATHETERIZATION   PROCEDURE: 1. Left heart catheterization. 2. Selective coronary angiography. 3. Stenting of the right coronary artery.  PROCEDURAL INDICATION:  Dawn Washington is a 45 year old woman who presented with non-ST-elevation infarction last year.  She underwent coronary stenting with a drug-eluting stent in the proximal LAD.  She has done well over the past 12-15 months but recently has developed recurrent exertional chest discomfort.  She underwent plain exercise treadmill study showing significant ST-segment depression with exertion.  She was referred back for cardiac cath.  Risks and indications of the procedure were reviewed with the patient. Informed consent was obtained.  The right wrist was prepped, draped, and anesthetized with 1% lidocaine using modified Seldinger technique.  A 5- French sheath was placed in the right radial artery.  A 3 mg of verapamil was given through the sheath, 4000 units of unfractionated heparin was administered intravenously.  Standard Judkins catheters were used for coronary angiography.  Review of the films demonstrated progressive severe stenosis in the distal right coronary artery, now with a focal 80-90% lesion.  The patient's LAD stent was widely patent. There was mild disease off the distal edge of the stent but no significant appearing stenosis was present.  I elected to move forward with PCI of the right coronary artery.  An additional 3 mg of verapamil was administered through the radial sheath.  Bivalirudin was started for anticoagulation.  A 5-French JR-4 guide catheter was inserted and once a therapeutic ACT was achieved, a cougar guidewire  was advanced into the PDA branch of the right coronary artery.  The vessel was primarily stented using a 2.75 x 12-mm Promus element drug-eluting stent.  The stent was deployed at 14 atmospheres and appeared well expanded.  The stent was postdilated with a 3.0 x 8-mm noncompliant balloon which was taken to 14 atmospheres on 2 inflation so that the entire stented segment was covered.  Final angiography demonstrated an excellent result with 0% residual stenosis at the lesion site and TIMI 3 flow in the vessel.  The patient tolerated the procedure well.  There were no immediate complications.  The TR band was used for radial hemostasis.  She has been maintained on long-term Plavix and did not receive an additional loading dose for the procedure.  DIAGNOSTIC FINDINGS:  Left ventricular pressure 102/18, aortic pressure 112/69 with a mean of 98.  Coronary angiography.  The right coronary artery is diffusely diseased. There is ectasia in the midvessel.  There are scattered 30-40% stenoses in the proximal and mid vessel.  The distal vessel has a focal 80-90% stenosis just before a distal acute marginal branch originates.  The PDA and posterolateral branches are patent without high-grade stenosis.  Left mainstem.  The ostial left mainstem has mild disease.  There is 20- 30% stenosis present.  The left main has diffuse irregularity, but no significant stenosis.  The mid  left main appears ectatic.  The left main divides into the LAD and left circumflex.  LAD.  The LAD is widely patent through the proximal stented segment. The first diagonal branch has mild 50% ostial stenosis.  The diagonal is a modest size vessel but supplies a fairly large territory with 3 sub- branches.  Just beyond the LAD stent, there is a hinge point with a 30- 40% stenosis.  There is another diagonal branch with diffuse disease in the second diagonal territory but there is no hemodynamically significant stenosis  throughout the second diagonal or the distal LAD.  Left circumflex.  The left circumflex gives off a first OM branch with a 50% proximal stenosis.  There is a very small intermediate branch present.  The second OM is small as is the AV groove circumflex.  There is no high-grade stenosis throughout the left circumflex distribution.  FINAL ASSESSMENT: 1. Widely patent proximal left anterior descending stent. 2. Severe distal right coronary artery stenosis with successful     primary stenting using a drug-eluting stent platform. 3. Mild-to-moderate diffuse coronary artery disease.  DISCUSSION:  The patient should remain on dual antiplatelet therapy with aspirin and Plavix for a minimum of 12 months.  We will check P2Y12 testing in the morning and if her PRU is greater than 208, would consider change to ticagrelor.  Also we will increase her rosuvastatin to 40 mg daily, even though her lipids have been at goal.  I think we need to be as aggressive as possible with medical management since this patient has aggressive premature CAD.     Veverly Fells. Excell Seltzer, MD     MDC/MEDQ  D:  05/01/2011  T:  05/01/2011  Job:  [id number]  Electronically Signed by [name] MD on 05/26/2011 05:22:22 AM

## 2011-05-26 NOTE — H&P (Addendum)
Dawn Washington, Dawn Washington                ACCOUNT NO.:  [id number]  MEDICAL RECORD NO.:  [id number]  LOCATION:  CATH                         FACILITY:  [facility]  PHYSICIAN:  Veverly Fells. Excell Seltzer, MD  DATE OF BIRTH:  1966/05/23  DATE OF ADMISSION:  04/21/2011 DATE OF DISCHARGE:                             HISTORY & PHYSICAL   ADDENDUM: This is an addendum H and P for planned cardiac catheterization on May 01, 2011.  The patient was seen at the time of an exercise treadmill study on April 14, 2011.  Dawn Washington is a 45 year old woman who has coronary artery disease.  She presented in May 2011 with a non-ST elevation infarction and required stenting of the proximal LAD.  A drug- eluting stent platform was used.  The patient has been doing well, and she was scheduled for an exercise treadmill stress test for 1-year ischemic surveillance study following her initial event.  When she presented for her treadmill study on April 14, 2011, the patient admitted to recurrent exertional chest and jaw tightness.  The patient's symptoms were less severe than at the time of her initial presentation.  Her treadmill study showed good exercise tolerance as she was able to exercise for 10 minutes and 22 seconds according to the Bruce protocol. She achieved 12.3 metabolic equivalents.  She did not have angina during exercise, but she did have significant horizontal ST-segment depression which initially resolved early in recovery but then returned again.  She also had frequent PVCs throughout early exercise.  Considering abnormal stress study and recurrent anginal symptoms, I have recommended cardiac catheterization.  Past medical history is pertinent for: 1. Coronary artery disease status post non-ST elevation infarction in     2011 with drug-eluting stent placement to the LAD.  At that time,     the patient was noted to have moderate left main stenosis, but this     resolved with intracoronary nitroglycerin.   Intravascular     ultrasound was performed and showed no evidence of significant left     main disease. 2. History of diverticulitis. 3. Dyslipidemia. 4. Gastroesophageal reflux disease.  HOME MEDICATIONS: 1. Acetaminophen 650 mg as needed. 2. Aspirin 81 mg daily. 3. Plavix 75 mg daily. 4. Fish oil 1 g daily. 5. Glucosamine 500 mg daily. 6. Metoprolol 25 mg twice daily. 7. Protonix 40 mg daily. 8. Crestor 20 mg daily.  ALLERGIES:  LATEX, PENICILLIN, and SULFA.  LABORATORY WORK:  A white blood cell count of 10.1, hemoglobin 12.9, platelet count 304,000.  Potassium is 3.7, creatinine 0.58, BUN is 6, glucose is 104, INR is 1.0.  Lipids from April 06, 2011, show a cholesterol of 134, triglycerides 78, HDL 47, LDL 72.  EKG shows normal sinus rhythm and is within normal limits at rest.  ASSESSMENT:  This is a 45 year old woman with stenting of the proximal left anterior descending 1 year ago.  She presents with recurrent exertional angina and an abnormal exercise treadmill suggesting myocardial ischemia.  The patient has been referred for cardiac cath. She initially would like to talk this over with her husband.  She will call us to let us know when she wants to schedule.  She will continue on her current medical program without changes.  Risks and indication of cardiac cath plus or minus percutaneous coronary intervention were reviewed with the patient in detail.  She understands and agrees to proceed.     Veverly Fells. Excell Seltzer, MD     MDC/MEDQ  D:  04/30/2011  T:  05/01/2011  Job:  [id number]  Electronically Signed by [name] MD on 05/26/2011 05:22:28 AM Electronically Signed by [name] MD on 05/26/2011 05:22:19 AM

## 2011-05-27 ENCOUNTER — Encounter: Payer: Self-pay | Admitting: Physician Assistant

## 2011-05-27 ENCOUNTER — Ambulatory Visit (INDEPENDENT_AMBULATORY_CARE_PROVIDER_SITE_OTHER): Payer: PRIVATE HEALTH INSURANCE | Admitting: Physician Assistant

## 2011-05-27 ENCOUNTER — Encounter: Payer: Self-pay | Admitting: *Deleted

## 2011-05-27 DIAGNOSIS — I2510 Atherosclerotic heart disease of native coronary artery without angina pectoris: Secondary | ICD-10-CM

## 2011-05-27 DIAGNOSIS — E785 Hyperlipidemia, unspecified: Secondary | ICD-10-CM

## 2011-05-27 DIAGNOSIS — R079 Chest pain, unspecified: Secondary | ICD-10-CM | POA: Insufficient documentation

## 2011-05-27 DIAGNOSIS — E7800 Pure hypercholesterolemia, unspecified: Secondary | ICD-10-CM

## 2011-05-27 DIAGNOSIS — I214 Non-ST elevation (NSTEMI) myocardial infarction: Secondary | ICD-10-CM

## 2011-05-27 LAB — BASIC METABOLIC PANEL
Calcium: 9.1 mg/dL (ref 8.4–10.5)
Chloride: 107 mEq/L (ref 96–112)
Creatinine, Ser: 0.9 mg/dL (ref 0.4–1.2)
Sodium: 141 mEq/L (ref 135–145)

## 2011-05-27 LAB — CBC WITH DIFFERENTIAL/PLATELET
Eosinophils Absolute: 0 10*3/uL (ref 0.0–0.7)
Eosinophils Relative: 0.1 % (ref 0.0–5.0)
Lymphocytes Relative: 21.7 % (ref 12.0–46.0)
MCV: 92.4 fl (ref 78.0–100.0)
Monocytes Absolute: 0.7 10*3/uL (ref 0.1–1.0)
Neutrophils Relative %: 70.6 % (ref 43.0–77.0)
Platelets: 273 10*3/uL (ref 150.0–400.0)
WBC: 9.4 10*3/uL (ref 4.5–10.5)

## 2011-05-27 LAB — PROTIME-INR
INR: 1.1 ratio — ABNORMAL HIGH (ref 0.8–1.0)
Prothrombin Time: 11.9 s (ref 10.2–12.4)

## 2011-05-27 MED ORDER — ISOSORBIDE MONONITRATE ER 30 MG PO TB24: ORAL_TABLET | ORAL | Status: DC

## 2011-05-27 NOTE — Assessment & Plan Note (Signed)
Continue ASA and Brilinta.  Proceed to cath as noted.

## 2011-05-27 NOTE — Assessment & Plan Note (Signed)
She is having the same chest pain she had prior to her PCI.  She had one week where she had no chest symptoms.  These have now recurred.  Her EKG is stable without significant changes.  I believe that she needs relook cardiac catheterization.  I discussed this with Dr. Johney Frame who agreed.  Risks and benefits of cardiac catheterization have been discussed with the patient.  These include bleeding, infection, kidney damage, stroke, heart attack, death.  The patient understands these risks and is willing to proceed.  We will set her up in the outpatient lab in the next couple days.  I will also give her Imdur 30 mg one half tablet daily.  She will continue her aspirin and Brilinta and statin and beta blocker.  She has been advised to go to the emergency room if her symptoms change or worsen.

## 2011-05-27 NOTE — Patient Instructions (Signed)
Your physician has recommended you make the following change in your medication: START ISOSORBIDE MONONITRATE 30 MG TABLET TAKE 1/2 (HALF) TABLET EVERY DAY  Your physician has requested that you have a LEFT HEART cardiac catheterization DX 786.50. Cardiac catheterization is used to diagnose and/or treat various heart conditions. Doctors may recommend this procedure for a number of different reasons. The most common reason is to evaluate chest pain. Chest pain can be a symptom of coronary artery disease (CAD), and cardiac catheterization can show whether plaque is narrowing or blocking your heart's arteries. This procedure is also used to evaluate the valves, as well as measure the blood flow and oxygen levels in different parts of your heart. For further information please visit [email or web address] Please follow instruction sheet, as given.  Your physician recommends that you return for lab work in: 2 MONTHS FOR FASTING LIVER/LIPID  PANEL  Your physician recommends that you return for lab work in: TODAY PT, BMET, CBC W/DIFF FOR PRE-CATH LABS

## 2011-05-27 NOTE — Progress Notes (Signed)
History of Present Illness: Primary Cardiologist: Dr. [name]  Dawn Washington is a 45 y.o. female who presents for post hospital follow up.  She has a history of CAD, status post NSTEMI in 2011 treated with a drug-eluting stent to the LAD, dyslipidemia, GERD.  She was evaluated in December 2011 by Dr. Excell Seltzer and set up for routine exercise treadmill test in 6 months.  This was done 7/3.  She had reported symptoms similar to her previous angina.  Her treadmill test was abnormal with significant horizontal ST segment depression.  She was therefore set up for cardiac catheterization.  Cardiac cath 7/20: Mid RCA ectatic, proximal and mid RCA 30-40%, distal RCA 80-90%, left main 20-30%, proximal LAD stent patent with 30-40% after the stent, D1 ostial 50%, pCFX 50%.  She therefore underwent PCI with placement of a Promus drug-eluting stent to the distal RCA.  Post procedure, her PRU was too high at 278.  She was therefore changed to Brilinta 90 mg twice a day.  Aggressive treatment of her cholesterol was also undertaken with increasing her Crestor.  Labs:  Hemoglobin 13.9, potassium 4.4, creatinine 0.58.  She was doing well post PCI for the first week.  She had no chest symptoms whatsoever.  She then developed left-sided chest discomfort for the last 2 weeks.  This is under her left breast.  It is sharp.  She notes radiation to her back.  It comes on with exertion and improves with rest.  She has not tried nitroglycerin.  She does get short of breath sometimes as well as nauseated.  She had one episode of near syncope.  She denies syncope.  She denies diaphoresis.  She sleeps on one pillow.  She denies orthopnea or PND or edema.  These are the same symptoms she presented with prior to her PCI.  These are also the same symptoms she had in the past with her angina.    Past Medical History  Diagnosis Date  . CAD (coronary artery disease)     NSTEMI in 2011 treated with DES to the LAD;    Cardiac cath 7/20:  Mid RCA ectatic, proximal and mid RCA 30-40%, distal RCA 80-90%, left main 20-30%, proximal LAD stent patent with 30-40% after the stent, D1 ostial 50%, pCFX 50%;  Status post DES to the RCA 05/01/11  . Diverticulitis   . Dyslipidemia   . Gastroesophageal reflux disease     Current Outpatient Prescriptions  Medication Sig Dispense Refill  . acetaminophen (TYLENOL) 650 MG CR tablet Take 650 mg by mouth every 8 (eight) hours as needed.        Marland Kitchen aspirin 81 MG tablet Take 81 mg by mouth daily.        Marland Kitchen BRILINTA 90 MG TABS tablet Take 1 tablet by mouth Twice daily.      . fish oil-omega-3 fatty acids 1000 MG capsule Take 1 g by mouth daily.        Marland Kitchen Lysine Acetate 500 MG TABS Take 1 tablet by mouth daily.        . metoprolol tartrate (LOPRESSOR) 25 MG tablet Take 1/2 tablet twice a day  30 tablet  6  . Multiple Vitamin (MULTIVITAMIN) tablet Take 1 tablet by mouth daily.        . nitroGLYCERIN (NITROSTAT) 0.4 MG SL tablet Place 1 tablet (0.4 mg total) under the tongue every 5 (five) minutes as needed for chest pain.  25 tablet  6  . rosuvastatin (CRESTOR) 40  MG tablet Take 40 mg by mouth daily.        . isosorbide mononitrate (IMDUR) 30 MG 24 hr tablet TAKE 1/2 (HALF) TABLET DAILY  30 tablet  11    Allergies: Allergies  Allergen Reactions  . Adhesive (Tape)   . Latex   . Penicillins   . Sulfonamide Derivatives     Social history:  Nonsmoker  ROS:  Please see the history of present illness.  All other systems reviewed and negative.   Vital Signs: BP 120/72  Pulse 55  Ht 5\' 7"  (1.702 m)  Wt 174 lb (78.926 kg)  BMI 27.25 kg/m2  PHYSICAL EXAM: Well nourished, well developed, in no acute distress HEENT: normal Neck: no JVD Endocrine: No thyromegaly Cardiac:  normal S1, S2; RRR; no murmur Lungs:  clear to auscultation bilaterally, no wheezing, rhonchi or rales Abd: soft, nontender, no hepatomegaly Ext: no edema; Right radial site without hematoma or bruit Skin: warm and  dry Neuro:  CNs 2-12 intact, no focal abnormalities noted Psych: Normal affect  EKG:  Sinus bradycardia, heart rate 55, normal axis, no ischemic changes  ASSESSMENT AND PLAN:

## 2011-05-27 NOTE — Assessment & Plan Note (Signed)
Crestor increased.  Check FLP and LFTs in 2 months.

## 2011-05-28 ENCOUNTER — Inpatient Hospital Stay (HOSPITAL_BASED_OUTPATIENT_CLINIC_OR_DEPARTMENT_OTHER)
Admission: RE | Admit: 2011-05-28 | Discharge: 2011-05-28 | Disposition: A | Discharge status: Home / Self Care | Payer: PRIVATE HEALTH INSURANCE | Source: Ambulatory Visit | Attending: Cardiovascular Disease | Admitting: Cardiovascular Disease

## 2011-05-28 DIAGNOSIS — I2510 Atherosclerotic heart disease of native coronary artery without angina pectoris: Secondary | ICD-10-CM | POA: Insufficient documentation

## 2011-05-28 DIAGNOSIS — Z9861 Coronary angioplasty status: Secondary | ICD-10-CM | POA: Insufficient documentation

## 2011-05-29 ENCOUNTER — Encounter: Payer: Self-pay | Admitting: *Deleted

## 2011-06-17 NOTE — Cardiovascular Report (Signed)
Dawn Washington, Dawn Washington NO.:  [id number]  MEDICAL RECORD NO.:  [id number]  LOCATION:  6525                         FACILITY:  [facility]  PHYSICIAN:  Veverly Fells. Excell Seltzer, MD  DATE OF BIRTH:  1966-03-20  DATE OF PROCEDURE:  05/28/2011 DATE OF DISCHARGE:  05/02/2011                           CARDIAC CATHETERIZATION   PROCEDURE: 1. Catheter placement for selective coronary angiography. 2. Selective coronary angiography.  PROCEDURAL INDICATIONS:  Dawn Washington is a 45 year old woman, well known to me.  She has coronary artery disease and initially presented with non- ST-elevation infarction in 2011.  She was found to have critical stenosis of the proximal LAD and was treated with a drug-eluting stent platform.  She developed recurrent exertional chest discomfort and underwent exercise treadmill testing in July.  This was abnormal and repeat cardiac catheterization demonstrated progressive high-grade stenosis of the right coronary artery.  She was treated with PCI using a drug-eluting stent platform.  She felt well for the first week after PCI, but has developed recurrent angina.  She was evaluated by Tereso Newcomer in the office and was referred for relook catheterization.  Risks and indications of procedure reviewed with the patient.  Informed consent was obtained.  The right groin was prepped and draped, anesthetized with 1% lidocaine.  Using modified Seldinger technique, a 4- French sheath was placed in the right femoral artery.  Standard Judkins catheters were used for coronary angiography.  Left ventriculography was deferred as the patient just recently underwent PCI and her left ventricular function was evaluated at that time.  PROCEDURAL FINDINGS:  Aortic pressure 123/73 with a mean of 94.  Left mainstem.  There was mild ostial stenosis of the left main.  There was vasospasm of the left main with a catheter engaged and intracoronary nitroglycerin was administered.   This resolved after nitroglycerin was given.  The ostial stenosis is estimated at 30%.  There is irregularity, but no significant stenoses throughout the left main.  The vessel divides into the LAD and left circumflex.  LAD.  There is a widely patent stent in the proximal LAD.  The first diagonal branch has 50% ostial stenosis.  Just beyond the LAD stent, there is a 30-40% stenosis present.  This is unchanged from previous study and it does not appear flow limiting.  Left circumflex.  The left circumflex is patent.  The first OM has 50% proximal stenosis.  There is a very small intermediate branch.  The second OM has no significant stenosis present.  Right coronary artery.  The right coronary artery has diffuse disease with ectasia in the midvessel.  There are diffuse 30-40% stenoses in the proximal and mid vessel.  The distal vessel demonstrates a widely patent stent.  The PDA branch is a small vessel with diffuse disease and there is 80% proximal stenosis.  This vessel was too small for PCI.  The posterolateral branches are patent.  ASSESSMENT: 1. Continued patency of the LAD and right coronary artery stents. 2. Mild-to-moderate diffuse coronary artery disease.  RECOMMENDATIONS:  The patient should continue with medical therapy for her CAD.  She was recently prescribed isosorbide, but has not started this yet.  I recommended  to go ahead and begin this medication.  She does not have any high-grade proximal coronary artery stenosis.     Veverly Fells. Excell Seltzer, MD     MDC/MEDQ  D:  05/30/2011  T:  05/30/2011  Job:  [id number]  Electronically Signed by [name] MD on 06/17/2011 11:33:36 PM

## 2011-06-24 ENCOUNTER — Encounter: Payer: Self-pay | Admitting: *Deleted

## 2011-06-25 ENCOUNTER — Ambulatory Visit (INDEPENDENT_AMBULATORY_CARE_PROVIDER_SITE_OTHER): Payer: PRIVATE HEALTH INSURANCE | Admitting: Cardiovascular Disease

## 2011-06-25 ENCOUNTER — Encounter: Payer: Self-pay | Admitting: Cardiovascular Disease

## 2011-06-25 DIAGNOSIS — I2510 Atherosclerotic heart disease of native coronary artery without angina pectoris: Secondary | ICD-10-CM

## 2011-06-25 DIAGNOSIS — E7800 Pure hypercholesterolemia, unspecified: Secondary | ICD-10-CM

## 2011-06-25 NOTE — Patient Instructions (Signed)
Your physician wants you to follow-up in: 6 months.  You will receive a reminder letter in the mail two months in advance. If you don't receive a letter, please call our office to schedule the follow-up appointment.  Your physician recommends that you return for fasting lab work July 13, 2011

## 2011-07-08 ENCOUNTER — Encounter: Payer: Self-pay | Admitting: Cardiovascular Disease

## 2011-07-08 NOTE — Assessment & Plan Note (Signed)
The patient is stable without angina. We will continue her current medical program.

## 2011-07-08 NOTE — Progress Notes (Signed)
HPI:  This is a 45 year old woman with premature coronary artery disease presented for follow up evaluation. She initially presented with non-ST elevation infarction 2011 and was found to have severe stenosis of the proximal LAD treated with a drug-eluting stent. She developed recurrent exertional chest pain in July and was found to have progressive stenosis of the right coronary artery, also treated with percutaneous stenting. Unfortunately, she presented again in August with recurrent chest pain and underwent a third cardiac catheterization procedure. This demonstrated patency of both stent sites. There was nonobstructive coronary disease and medical therapy was recommended.  The patient is doing well at present. She describes chest tightness with running up stairs. She has no symptoms with normal activities or even with walking for exercise. She has had problems with acid reflux. She denies dyspnea, orthopnea, or PND.  Outpatient Encounter Prescriptions as of 06/25/2011  Medication Sig Dispense Refill  . acetaminophen (TYLENOL) 650 MG CR tablet Take 650 mg by mouth every 8 (eight) hours as needed.        Marland Kitchen aspirin 81 MG tablet Take 81 mg by mouth daily.        Marland Kitchen BRILINTA 90 MG TABS tablet Take 1 tablet by mouth Twice daily.      . fish oil-omega-3 fatty acids 1000 MG capsule Take 1 g by mouth daily.        . isosorbide mononitrate (IMDUR) 30 MG 24 hr tablet TAKE 1/2 (HALF) TABLET DAILY  30 tablet  11  . Lysine Acetate 500 MG TABS Take 1 tablet by mouth daily.        . metoprolol tartrate (LOPRESSOR) 25 MG tablet Take 1/2 tablet twice a day  30 tablet  6  . Multiple Vitamin (MULTIVITAMIN) tablet Take 1 tablet by mouth daily.        . nitroGLYCERIN (NITROSTAT) 0.4 MG SL tablet Place 1 tablet (0.4 mg total) under the tongue every 5 (five) minutes as needed for chest pain.  25 tablet  6  . rosuvastatin (CRESTOR) 40 MG tablet Take 40 mg by mouth daily.          Allergies  Allergen Reactions  .  Adhesive (Tape)   . Latex   . Penicillins   . Sulfonamide Derivatives     Past Medical History  Diagnosis Date  . CAD (coronary artery disease)     NSTEMI in 2011 treated with DES to the LAD;    Cardiac cath 7/20: Mid RCA ectatic, proximal and mid RCA 30-40%, distal RCA 80-90%, left main 20-30%, proximal LAD stent patent with 30-40% after the stent, D1 ostial 50%, pCFX 50%;  Status post DES to the RCA 05/01/11  . Diverticulitis   . Dyslipidemia   . Gastroesophageal reflux disease     ROS: Negative except as per HPI  BP 100/60  Pulse 50  Ht 5\' 7"  (1.702 m)  Wt 176 lb 12.8 oz (80.196 kg)  BMI 27.69 kg/m2  PHYSICAL EXAM: Pt is alert and oriented, NAD HEENT: normal Neck: JVP - normal, carotids 2+= without bruits Lungs: CTA bilaterally CV: RRR without murmur or gallop Abd: soft, NT, Positive BS, no hepatomegaly Ext: no C/C/E, distal pulses intact and equal Skin: warm/dry no rash  ASSESSMENT AND PLAN:

## 2011-07-08 NOTE — Assessment & Plan Note (Signed)
Lipids are aggressively treated with Crestor 40 mg. Last LDL was 72

## 2011-07-09 ENCOUNTER — Telehealth: Payer: Self-pay

## 2011-07-09 NOTE — Telephone Encounter (Signed)
I spoke with the pt and she is taking Protonix.  The pt said that the nurse called the medication into the pharmacy.  I will update the pt's medication list.

## 2011-07-09 NOTE — Telephone Encounter (Signed)
Dawn Washington --would you mind calling her to see if she wants Rx for protonix.  I think I mentioned it to her but I don't see anything documented.  Thx  (note from Dr Excell Seltzer)  I left a message for the pt to call back.

## 2011-07-13 ENCOUNTER — Other Ambulatory Visit (INDEPENDENT_AMBULATORY_CARE_PROVIDER_SITE_OTHER): Payer: PRIVATE HEALTH INSURANCE | Admitting: *Deleted

## 2011-07-13 DIAGNOSIS — E7800 Pure hypercholesterolemia, unspecified: Secondary | ICD-10-CM

## 2011-07-13 DIAGNOSIS — E785 Hyperlipidemia, unspecified: Secondary | ICD-10-CM

## 2011-07-13 DIAGNOSIS — I214 Non-ST elevation (NSTEMI) myocardial infarction: Secondary | ICD-10-CM

## 2011-07-13 DIAGNOSIS — I2510 Atherosclerotic heart disease of native coronary artery without angina pectoris: Secondary | ICD-10-CM

## 2011-07-13 DIAGNOSIS — R079 Chest pain, unspecified: Secondary | ICD-10-CM

## 2011-07-13 LAB — HEPATIC FUNCTION PANEL
Bilirubin, Direct: 0.1 mg/dL (ref 0.0–0.3)
Total Bilirubin: 0.5 mg/dL (ref 0.3–1.2)

## 2011-07-13 LAB — LIPID PANEL
HDL: 54 mg/dL (ref >39.00)
LDL Cholesterol: 46 mg/dL (ref 0–99)
Total CHOL/HDL Ratio: 2
Triglycerides: 136 mg/dL (ref 0.0–149.0)

## 2011-11-12 ENCOUNTER — Other Ambulatory Visit: Payer: Self-pay | Admitting: *Deleted

## 2011-11-12 DIAGNOSIS — I2510 Atherosclerotic heart disease of native coronary artery without angina pectoris: Secondary | ICD-10-CM

## 2011-11-12 DIAGNOSIS — R079 Chest pain, unspecified: Secondary | ICD-10-CM

## 2011-11-12 DIAGNOSIS — E785 Hyperlipidemia, unspecified: Secondary | ICD-10-CM

## 2011-11-12 DIAGNOSIS — I214 Non-ST elevation (NSTEMI) myocardial infarction: Secondary | ICD-10-CM

## 2011-11-12 DIAGNOSIS — E7800 Pure hypercholesterolemia, unspecified: Secondary | ICD-10-CM

## 2011-11-12 MED ORDER — ROSUVASTATIN CALCIUM 40 MG PO TABS: 40.0000 mg | ORAL_TABLET | Freq: Every day | ORAL | Status: DC

## 2011-11-17 ENCOUNTER — Other Ambulatory Visit: Payer: Self-pay | Admitting: *Deleted

## 2011-11-17 DIAGNOSIS — I2510 Atherosclerotic heart disease of native coronary artery without angina pectoris: Secondary | ICD-10-CM

## 2011-11-17 DIAGNOSIS — E785 Hyperlipidemia, unspecified: Secondary | ICD-10-CM

## 2011-11-17 DIAGNOSIS — R079 Chest pain, unspecified: Secondary | ICD-10-CM

## 2011-11-17 DIAGNOSIS — E7800 Pure hypercholesterolemia, unspecified: Secondary | ICD-10-CM

## 2011-11-17 DIAGNOSIS — I214 Non-ST elevation (NSTEMI) myocardial infarction: Secondary | ICD-10-CM

## 2011-11-17 MED ORDER — ROSUVASTATIN CALCIUM 40 MG PO TABS: 40.0000 mg | ORAL_TABLET | Freq: Every day | ORAL | Status: DC

## 2011-12-31 ENCOUNTER — Ambulatory Visit (INDEPENDENT_AMBULATORY_CARE_PROVIDER_SITE_OTHER): Payer: PRIVATE HEALTH INSURANCE | Admitting: Cardiovascular Disease

## 2011-12-31 ENCOUNTER — Encounter: Payer: Self-pay | Admitting: Cardiovascular Disease

## 2011-12-31 DIAGNOSIS — E7800 Pure hypercholesterolemia, unspecified: Secondary | ICD-10-CM

## 2011-12-31 DIAGNOSIS — I2510 Atherosclerotic heart disease of native coronary artery without angina pectoris: Secondary | ICD-10-CM

## 2011-12-31 MED ORDER — PANTOPRAZOLE SODIUM 40 MG PO TBEC: 40.0000 mg | DELAYED_RELEASE_TABLET | Freq: Every day | ORAL | Status: DC

## 2011-12-31 NOTE — Progress Notes (Addendum)
HPI:  This is a 46 year old woman with premature coronary artery disease presenting for followup evaluation.  She was diagnosed with CAD in 2011 when she presented with non-ST elevation infarction. She was found to have severe stenosis in the proximal LAD and was treated with a drug-eluting stent platform. Gen. repeat catheterization in 2012 after presenting with recurrent exertional chest pain. She was found to have progressive and severe stenosis in the distal right coronary artery and was treated with another drug-eluting stent. A third cardiac cath was done when she had further exertional angina but this demonstrated wide patency of both stent sites and no significant obstructive disease. Medical therapy was recommended. She has done well until the past week when she has noted recurrent chest pain, sometimes at rest and sometimes with exertion. She describes a tightness below the left breast and this is similar to her symptoms in the past. She denies dyspnea, palpitations, edema, orthopnea, or PND. She has not taken nitroglycerin. Her episodes have been self-limited.  Outpatient Encounter Prescriptions as of 12/31/2011  Medication Sig Dispense Refill  . acetaminophen (TYLENOL) 650 MG CR tablet Take 650 mg by mouth every 8 (eight) hours as needed.        Marland Kitchen aspirin 81 MG tablet Take 81 mg by mouth daily.        Marland Kitchen BRILINTA 90 MG TABS tablet Take 1 tablet by mouth Twice daily.      . isosorbide mononitrate (IMDUR) 30 MG 24 hr tablet TAKE 1/2 (HALF) TABLET DAILY  30 tablet  11  . Lysine Acetate 500 MG TABS Take 1 tablet by mouth daily.        . metoprolol tartrate (LOPRESSOR) 25 MG tablet Take 1/2 tablet twice a day  30 tablet  6  . Multiple Vitamin (MULTIVITAMIN) tablet Take 1 tablet by mouth daily.        . nitroGLYCERIN (NITROSTAT) 0.4 MG SL tablet Place 1 tablet (0.4 mg total) under the tongue every 5 (five) minutes as needed for chest pain.  25 tablet  6  . rosuvastatin (CRESTOR) 40 MG tablet Take  1 tablet (40 mg total) by mouth daily.  30 tablet  6  . pantoprazole (PROTONIX) 40 MG tablet Take 1 tablet (40 mg total) by mouth daily.      Marland Kitchen DISCONTD: fish oil-omega-3 fatty acids 1000 MG capsule Take 1 g by mouth daily.          Allergies  Allergen Reactions  . Adhesive (Tape)   . Latex   . Penicillins   . Sulfonamide Derivatives     Past Medical History  Diagnosis Date  . CAD (coronary artery disease)     NSTEMI in 2011 treated with DES to the LAD;    Cardiac cath 7/20: Mid RCA ectatic, proximal and mid RCA 30-40%, distal RCA 80-90%, left main 20-30%, proximal LAD stent patent with 30-40% after the stent, D1 ostial 50%, pCFX 50%;  Status post DES to the RCA 05/01/11  . Diverticulitis   . Dyslipidemia   . Gastroesophageal reflux disease     ROS: Negative except as per HPI  BP 110/82  Pulse 61  Ht 5\' 8"  (1.727 m)  Wt 81.557 kg (179 lb 12.8 oz)  BMI 27.34 kg/m2  PHYSICAL EXAM: Pt is alert and oriented, pleasant young woman in NAD HEENT: normal Neck: JVP - normal, carotids 2+= without bruits Lungs: CTA bilaterally CV: RRR without murmur or gallop Abd: soft, NT, Positive BS, no hepatomegaly Ext: no  C/C/E, distal pulses intact and equal Skin: warm/dry no rash  EKG:  Normal sinus rhythm 61 beats per minute, within normal limits.  ASSESSMENT AND PLAN:

## 2011-12-31 NOTE — Assessment & Plan Note (Signed)
The patient has symptoms concerning for recurrent angina, CCS class III. Her EKG is normal and she remains on a stable medical regimen.  I have recommended an exercise Myoview stress scan to rule out significant ischemia. Depending on the findings, we'll consider the need for repeat catheterization, but hopefully this can be avoided if her nuclear study is low risk.  For now, I will continue her current medical program which includes dual antiplatelet therapy with aspirin and Brinlinta. She understands the need to seek immediate medical attention if she develops progressive or unrelenting chest pain.

## 2011-12-31 NOTE — Patient Instructions (Signed)
Your physician wants you to follow-up in: 6 months.  You will receive a reminder letter in the mail two months in advance. If you don't receive a letter, please call our office to schedule the follow-up appointment.  Your physician has requested that you have en exercise stress myoview. For further information please visit [email or web address] Please follow instruction sheet, as given.   Your physician recommends that you return for fasting lab work on day of stress test--Lipid and liver profile

## 2011-12-31 NOTE — Assessment & Plan Note (Signed)
Lipids have been at goal on high dose Crestor. Transaminases were mildly elevated at the time of her last blood work 6 months ago. Will repeat these studies.

## 2012-01-11 ENCOUNTER — Ambulatory Visit (HOSPITAL_COMMUNITY): Payer: PRIVATE HEALTH INSURANCE | Attending: Cardiology | Admitting: Radiology

## 2012-01-11 ENCOUNTER — Other Ambulatory Visit (INDEPENDENT_AMBULATORY_CARE_PROVIDER_SITE_OTHER): Payer: PRIVATE HEALTH INSURANCE

## 2012-01-11 DIAGNOSIS — R42 Dizziness and giddiness: Secondary | ICD-10-CM | POA: Insufficient documentation

## 2012-01-11 DIAGNOSIS — I10 Essential (primary) hypertension: Secondary | ICD-10-CM | POA: Insufficient documentation

## 2012-01-11 DIAGNOSIS — Z87891 Personal history of nicotine dependence: Secondary | ICD-10-CM | POA: Insufficient documentation

## 2012-01-11 DIAGNOSIS — E7800 Pure hypercholesterolemia, unspecified: Secondary | ICD-10-CM

## 2012-01-11 DIAGNOSIS — I252 Old myocardial infarction: Secondary | ICD-10-CM | POA: Insufficient documentation

## 2012-01-11 DIAGNOSIS — R079 Chest pain, unspecified: Secondary | ICD-10-CM

## 2012-01-11 DIAGNOSIS — Z8249 Family history of ischemic heart disease and other diseases of the circulatory system: Secondary | ICD-10-CM | POA: Insufficient documentation

## 2012-01-11 DIAGNOSIS — E785 Hyperlipidemia, unspecified: Secondary | ICD-10-CM | POA: Insufficient documentation

## 2012-01-11 DIAGNOSIS — R0989 Other specified symptoms and signs involving the circulatory and respiratory systems: Secondary | ICD-10-CM | POA: Insufficient documentation

## 2012-01-11 DIAGNOSIS — I2510 Atherosclerotic heart disease of native coronary artery without angina pectoris: Secondary | ICD-10-CM | POA: Insufficient documentation

## 2012-01-11 DIAGNOSIS — R110 Nausea: Secondary | ICD-10-CM | POA: Insufficient documentation

## 2012-01-11 DIAGNOSIS — R5381 Other malaise: Secondary | ICD-10-CM | POA: Insufficient documentation

## 2012-01-11 DIAGNOSIS — R5383 Other fatigue: Secondary | ICD-10-CM | POA: Insufficient documentation

## 2012-01-11 DIAGNOSIS — R0789 Other chest pain: Secondary | ICD-10-CM | POA: Insufficient documentation

## 2012-01-11 DIAGNOSIS — R0609 Other forms of dyspnea: Secondary | ICD-10-CM | POA: Insufficient documentation

## 2012-01-11 DIAGNOSIS — Z9861 Coronary angioplasty status: Secondary | ICD-10-CM | POA: Insufficient documentation

## 2012-01-11 DIAGNOSIS — R002 Palpitations: Secondary | ICD-10-CM | POA: Insufficient documentation

## 2012-01-11 DIAGNOSIS — R0602 Shortness of breath: Secondary | ICD-10-CM

## 2012-01-11 DIAGNOSIS — R000 Tachycardia, unspecified: Secondary | ICD-10-CM | POA: Insufficient documentation

## 2012-01-11 LAB — HEPATIC FUNCTION PANEL
ALT: 44 U/L — ABNORMAL HIGH (ref 0–35)
AST: 47 U/L — ABNORMAL HIGH (ref 0–37)
Alkaline Phosphatase: 57 U/L (ref 39–117)
Bilirubin, Direct: 0.1 mg/dL (ref 0.0–0.3)
Total Bilirubin: 0.3 mg/dL (ref 0.3–1.2)

## 2012-01-11 MED ORDER — TECHNETIUM TC 99M TETROFOSMIN IV KIT
30.0000 | PACK | Freq: Once | INTRAVENOUS | Status: AC | PRN
  Administered 2012-01-11: 30 via INTRAVENOUS

## 2012-01-11 MED ORDER — TECHNETIUM TC 99M TETROFOSMIN IV KIT
10.0000 | PACK | Freq: Once | INTRAVENOUS | Status: AC | PRN
  Administered 2012-01-11: 10 via INTRAVENOUS

## 2012-01-11 NOTE — Progress Notes (Signed)
[facility] 3 NUCLEAR MED [address] [place] [address] [phone]  Cardiology Nuclear Med Study  Dawn Washington is a 46 y.o. female     MRN : [id number]     DOB: 04-10-1966  Procedure Date: 01/11/2012  Nuclear Med Background Indication for Stress Test:  Evaluation for Ischemia and PTCA/Stent Patency History:  10/11(Nonstemi) Myocardial Infarction treated with Stent Prox.LAD, 7/12 GXT: ST depression> Stent RCA, 8/12 Heart Catheterization-Patent stents, mild-moderate diffuse disease (Nonobstructive), Tx med Cardiac Risk Factors: Family History - CAD, History of Smoking, Hypertension and Lipids  Symptoms: Chest pain and Chest Tightness with/without exertion (last occurrence yesterday),  Dizziness, DOE, Fatigue, Fatigue with Exertion, Light-Headedness, Nausea, Palpitations and Rapid HR   Nuclear Pre-Procedure Caffeine/Decaff Intake:  7:00pm NPO After: 7:00pm   Lungs:  clear O2 Sat: 98% on room air. IV 0.9% NS with Angio Cath:  22g  IV Site: R Antecubital x 1, tolerated well IV Started by:  Irean Hong, RN  Chest Size (in):  38 Cup Size: B  Height: 5\' 8"  (1.727 m)  Weight:  178 lb (80.74 kg)  BMI:  Body mass index is 27.06 kg/(m^2). Tech Comments:  Took metoprolol 9:00 pm last night    Nuclear Med Study 1 or 2 day study: 1 day  Stress Test Type:  Stress  Reading MD: Willa Rough, MD  Order Authorizing Provider:  [name], MD  Resting Radionuclide: Technetium 57m Tetrofosmin  Resting Radionuclide Dose: 10.9 mCi   Stress Radionuclide:  Technetium 83m Tetrofosmin  Stress Radionuclide Dose: 32.8 mCi           Stress Protocol Rest HR: 64 Stress HR: 157  Rest BP: 103/77 Stress BP: 178/81  Exercise Time (min): 10:30 METS: 12.5   Predicted Max HR: 174 bpm % Max HR: 90.23 bpm Rate Pressure Product: 44034   Dose of Adenosine (mg):  n/a Dose of Lexiscan: n/a mg  Dose of Atropine (mg): n/a Dose of Dobutamine: n/a mcg/kg/min (at max HR)  Stress  Test Technologist: Irean Hong, RN  Nuclear Technologist:  Domenic Polite, CNMT     Rest Procedure:  Myocardial perfusion imaging was performed at rest 45 minutes following the intravenous administration of Technetium 21m Tetrofosmin. Rest ECG: NSR with frequent baseline PVC's (Asymptomatic)  Stress Procedure:  The patient performed treadmill exercise using a Bruce  Protocol for 10 minutes and 30 seconds, RPE=17 minutes. The patient stopped due to DOE and chest tightness 3/10.  There were significant ST-T wave changes. There were occasional PVC's and PAC's with exercise. There was a drop in BP immediately post exercise to BP 156/75, RPE was 17. Technetium 46m Tetrofosmin was injected at peak exercise and myocardial perfusion imaging was performed after a brief delay. Stress ECG: No significant change from baseline ECG  QPS Raw Data Images:  Normal; no motion artifact; normal heart/lung ratio. Stress Images:  Normal homogeneous uptake in all areas of the myocardium. Rest Images:  Normal homogeneous uptake in all areas of the myocardium. Subtraction (SDS):  No evidence of ischemia. Transient Ischemic Dilatation (Normal <1.22):  1.06 Lung/Heart Ratio (Normal <0.45):  0.26  Quantitative Gated Spect Images QGS EDV:  76 ml QGS ESV:  21 ml  Impression Exercise Capacity:  Good exercise capacity. BP Response:  Slight decrease in BP post stress, not significant. Clinical Symptoms:  3/10 chest tightness. ECG Impression:  No significant ST segment change suggestive of ischemia. Comparison with Prior Nuclear Study: No images to compare  Overall Impression:  Normal stress nuclear study. Despite some chest tightness, there is no definite abnormality.  LV Ejection Fraction: 73%.  LV Wall Motion:  Normal Wall Motion  Willa Rough, MD

## 2012-01-12 ENCOUNTER — Encounter: Payer: Self-pay | Admitting: Cardiovascular Disease

## 2012-01-12 NOTE — Telephone Encounter (Signed)
Patient returning call from nurse, she can be reached at [phone].

## 2012-01-12 NOTE — Telephone Encounter (Signed)
This encounter was created in error - please disregard.

## 2012-03-09 IMAGING — CR DG CHEST 1V PORT
1 series · 1 of 1 positions shown · non-contrast
Comparison: None.

CLINICAL DATA: Chest pain

PORTABLE CHEST - 1 VIEW

[view not recorded]
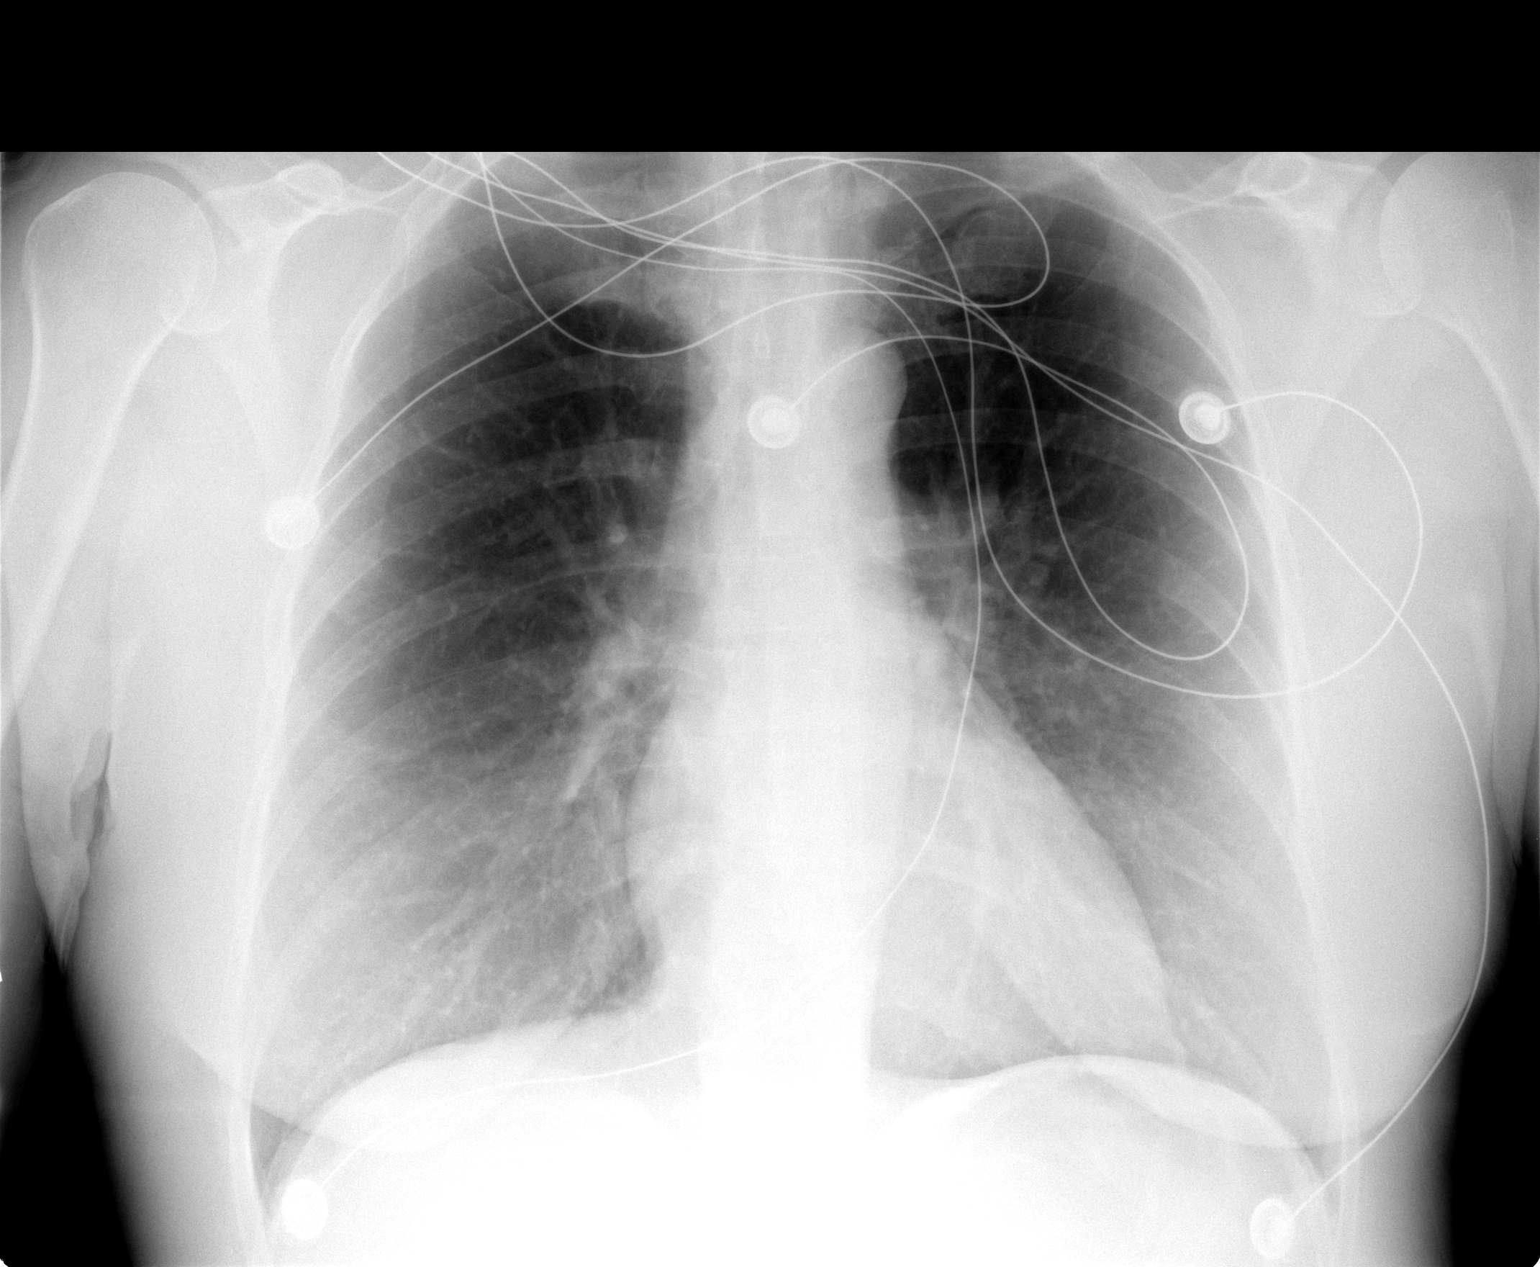

[1 of 1 positions shown; findings below may reference images not displayed]

FINDINGS: Cardiomediastinal silhouette is unremarkable.  No acute
infiltrate or pleural effusion.  No pulmonary edema.  Bony thorax
is unremarkable.
IMPRESSION: No active disease.

## 2012-03-10 ENCOUNTER — Telehealth: Payer: Self-pay | Admitting: Cardiovascular Disease

## 2012-03-10 NOTE — Telephone Encounter (Signed)
I spoke with the pt and made her aware of Dr Earmon Phoenix recommendation.  The pt will discontinue Metoprolol and continue to monitor her BP and pulse.  The pt will contact our office with any other questions or concerns. The pt is aware that she needs an EKG if her pulse decreases again.

## 2012-03-10 NOTE — Telephone Encounter (Signed)
I spoke with the patient and she is not having an issue with her BP.  The pt is having an issue with her pulse running in the 30's and 40's(per pulse oximeter) for the past couple of weeks and feeling lightheaded.  The pt did not take her Metoprolol this morning and her pulse is running in the low 60's, BP 105/68. I instructed the pt to continue to hold Metoprolol at this time.  I will forward this message to Dr Excell Seltzer for review and further recommendations.

## 2012-03-10 NOTE — Telephone Encounter (Signed)
New Problem:    Patient called because she is having issues with her blood pressure dropping too low.  Please call back.

## 2012-03-10 NOTE — Telephone Encounter (Signed)
Agree with plan. If problem recurs she should come in for an EKG to rule out ventricular bigeminy or other arrhythmia. thx.

## 2012-04-26 ENCOUNTER — Other Ambulatory Visit: Payer: Self-pay | Admitting: Cardiovascular Disease

## 2012-04-27 ENCOUNTER — Other Ambulatory Visit: Payer: Self-pay | Admitting: *Deleted

## 2012-05-29 ENCOUNTER — Other Ambulatory Visit: Payer: Self-pay | Admitting: Cardiovascular Disease

## 2012-06-11 ENCOUNTER — Other Ambulatory Visit: Payer: Self-pay | Admitting: Cardiovascular Disease

## 2012-07-08 ENCOUNTER — Telehealth: Payer: Self-pay | Admitting: Cardiovascular Disease

## 2012-07-08 NOTE — Telephone Encounter (Signed)
Pt c/o increased heart rate of 118.  Pt states that she stopped her metoprolol 6 days ago because her heart rate was too low.  Advised pt to resume taking metoprolol and keep a check on her HR and b/p.  Advised pt to go to the ED if experiencing chest pain, sob, n/v.  Rescheduled appt to see Dr. Excell Seltzer 07/15/2012.  Pt agrees to plan.

## 2012-07-08 NOTE — Telephone Encounter (Signed)
New problem:  C/O  Heart rate this am 118. MD at her office check pulse determine she was skipping every th beat.  Some discomfort.

## 2012-07-11 ENCOUNTER — Telehealth: Payer: Self-pay | Admitting: Cardiovascular Disease

## 2012-07-11 NOTE — Telephone Encounter (Signed)
Agree with plan 

## 2012-07-11 NOTE — Telephone Encounter (Signed)
New problem:  Was told to call back with blood pressure reading over the weekend.   09/28 103/44 hr 43.  09/27 102/47 hr 49.  09/30 94/65   Hr 52.

## 2012-07-11 NOTE — Telephone Encounter (Signed)
The pt is currently taking Metoprolol Tartrate 12.5mg  take one by mouth twice a day.  I will forward this information to Dr Excell Seltzer for review and further recommendations.     Lorenza Chick [place], [place] 4/69/6295 28:41 AM Signed  Pt c/o increased heart rate of 118. Pt states that she stopped her metoprolol 6 days ago because her heart rate was too low. Advised pt to resume taking metoprolol and keep a check on her HR and b/p. Advised pt to go to the ED if experiencing chest pain, sob, n/v. Rescheduled appt to see Dr. Excell Seltzer 07/15/2012. Pt agrees to plan.

## 2012-07-13 ENCOUNTER — Other Ambulatory Visit: Payer: Self-pay

## 2012-07-13 DIAGNOSIS — R079 Chest pain, unspecified: Secondary | ICD-10-CM

## 2012-07-13 DIAGNOSIS — E785 Hyperlipidemia, unspecified: Secondary | ICD-10-CM

## 2012-07-13 DIAGNOSIS — I2510 Atherosclerotic heart disease of native coronary artery without angina pectoris: Secondary | ICD-10-CM

## 2012-07-13 DIAGNOSIS — I214 Non-ST elevation (NSTEMI) myocardial infarction: Secondary | ICD-10-CM

## 2012-07-13 DIAGNOSIS — E7800 Pure hypercholesterolemia, unspecified: Secondary | ICD-10-CM

## 2012-07-13 MED ORDER — ISOSORBIDE MONONITRATE ER 30 MG PO TB24: ORAL_TABLET | ORAL | Status: DC

## 2012-07-13 NOTE — Telephone Encounter (Signed)
..   Requested Prescriptions   Signed Prescriptions Disp Refills  . isosorbide mononitrate (IMDUR) 30 MG 24 hr tablet 30 tablet 11    Sig: TAKE 1/2 (HALF) TABLET DAILY    Authorizing Provider: Rollene Rotunda    Ordering User: Christella Hartigan, Tymar Polyak Judie Petit

## 2012-07-15 ENCOUNTER — Ambulatory Visit (INDEPENDENT_AMBULATORY_CARE_PROVIDER_SITE_OTHER): Payer: BC Managed Care – PPO | Admitting: Cardiovascular Disease

## 2012-07-15 ENCOUNTER — Encounter: Payer: Self-pay | Admitting: Cardiovascular Disease

## 2012-07-15 VITALS — BP 130/82 | HR 71 | Resp 18 | Ht 68.0 in | Wt 157.2 lb

## 2012-07-15 DIAGNOSIS — I10 Essential (primary) hypertension: Secondary | ICD-10-CM

## 2012-07-15 DIAGNOSIS — E7800 Pure hypercholesterolemia, unspecified: Secondary | ICD-10-CM

## 2012-07-15 DIAGNOSIS — I2510 Atherosclerotic heart disease of native coronary artery without angina pectoris: Secondary | ICD-10-CM

## 2012-07-15 LAB — BASIC METABOLIC PANEL
BUN: 9 mg/dL (ref 6–23)
GFR: 80.65 mL/min (ref >60.00)
Potassium: 4.1 mEq/L (ref 3.5–5.1)
Sodium: 137 mEq/L (ref 135–145)

## 2012-07-15 LAB — LIPID PANEL
Cholesterol: 125 mg/dL (ref 0–200)
HDL: 57.2 mg/dL (ref >39.00)
LDL Cholesterol: 52 mg/dL (ref 0–99)
VLDL: 16 mg/dL (ref 0.0–40.0)

## 2012-07-15 LAB — CBC WITH DIFFERENTIAL/PLATELET
Basophils Relative: 0.4 % (ref 0.0–3.0)
Eosinophils Absolute: 0 10*3/uL (ref 0.0–0.7)
MCHC: 32.8 g/dL (ref 30.0–36.0)
MCV: 91.2 fl (ref 78.0–100.0)
Monocytes Absolute: 0.6 10*3/uL (ref 0.1–1.0)
Neutrophils Relative %: 66.5 % (ref 43.0–77.0)
Platelets: 246 10*3/uL (ref 150.0–400.0)

## 2012-07-15 LAB — HEPATIC FUNCTION PANEL
AST: 26 U/L (ref 0–37)
Total Bilirubin: 0.7 mg/dL (ref 0.3–1.2)

## 2012-07-15 MED ORDER — NITROGLYCERIN 0.4 MG SL SUBL: 0.4000 mg | SUBLINGUAL_TABLET | SUBLINGUAL | Status: DC | PRN

## 2012-07-15 NOTE — Patient Instructions (Addendum)
Your physician has recommended you make the following change in your medication: STOP Isosorbide MN, Brilinta and Metoprolol Tartrate  Your physician recommends that you have lab work today: LIPID, LIVER, BMP and CBC  Your physician wants you to follow-up in: 6 MONTHS. You will receive a reminder letter in the mail two months in advance. If you don't receive a letter, please call our office to schedule the follow-up appointment.

## 2012-07-15 NOTE — Progress Notes (Signed)
HPI:  45 year old woman presenting for followup evaluation. The patient has coronary artery disease and initially presented in 2011 with a non-ST elevation MI. She underwent PCI of a critical stenosis in her proximal LAD. She had recurrent unstable angina and had a new lesion in the distal right coronary artery in 2012 treated again with a drug-eluting stent. She had a followup cardiac catheterization after again developing chest pain and this demonstrated patency of both of her stent sites. Her last stress test was in April 2013 and it demonstrated normal left ventricular function with the gated ejection fraction of 73% and normal perfusion throughout. Lab work in April showed a cholesterol of 135, HDL 49, LDL 60, and triglycerides 161. LFTs were minimally elevated with an AST of 47 and an ALT of 44.  The patient has gone to a low carbohydrate diet and has lost 22 pounds since I saw her in March. She's felt lightheaded and weak at times. She continues to have some chest pain, at times with exertion and at times at rest. Overall her pain symptoms are not as bad as they have been in the past. She's had no pain radiating down her arms which has been her anginal equivalent in the past. She is able to walk for about 30 minutes for exercise a least a few times per week. She has not taken any nitroglycerin since I have seen her last. She has not had frank syncope. She does admit to some vaginal bleeding and would like to come off of some of her blood thinning medicine if possible.  Outpatient Encounter Prescriptions as of 07/15/2012  Medication Sig Dispense Refill  . acetaminophen (TYLENOL) 650 MG CR tablet Take 650 mg by mouth every 8 (eight) hours as needed.        Marland Kitchen aspirin 81 MG tablet Take 81 mg by mouth daily.        . CRESTOR 40 MG tablet TAKE 1 TABLET BY MOUTH EVERY DAY  30 tablet  9  . isosorbide mononitrate (IMDUR) 30 MG 24 hr tablet TAKE 1/2 (HALF) TABLET DAILY  30 tablet  11  . Lysine Acetate 500  MG TABS Take 1 tablet by mouth daily.        . metoprolol tartrate (LOPRESSOR) 25 MG tablet TAKE 1/2 TABLET BY MOUTH TWICE DAILY  30 tablet  10  . Multiple Vitamin (MULTIVITAMIN) tablet Take 1 tablet by mouth daily.        . nitroGLYCERIN (NITROSTAT) 0.4 MG SL tablet Place 0.4 mg under the tongue every 5 (five) minutes as needed.      . pantoprazole (PROTONIX) 40 MG tablet Take 1 tablet (40 mg total) by mouth daily.  30 tablet  11  . rosuvastatin (CRESTOR) 40 MG tablet Take 1 tablet (40 mg total) by mouth daily.  30 tablet  6  . Ticagrelor (BRILINTA) 90 MG TABS tablet Take 1 tablet (90 mg total) by mouth 2 (two) times daily.  60 tablet  6  . DISCONTD: nitroGLYCERIN (NITROSTAT) 0.4 MG SL tablet Place 1 tablet (0.4 mg total) under the tongue every 5 (five) minutes as needed for chest pain.  25 tablet  6    Allergies  Allergen Reactions  . Adhesive (Tape)   . Latex   . Penicillins   . Sulfonamide Derivatives     Past Medical History  Diagnosis Date  . CAD (coronary artery disease)     NSTEMI in 2011 treated with DES to the LAD;  Cardiac cath 7/20: Mid RCA ectatic, proximal and mid RCA 30-40%, distal RCA 80-90%, left main 20-30%, proximal LAD stent patent with 30-40% after the stent, D1 ostial 50%, pCFX 50%;  Status post DES to the RCA 05/01/11  . Diverticulitis   . Dyslipidemia   . Gastroesophageal reflux disease     ROS: Negative except as per HPI  BP 130/82  Pulse 71  Resp 18  Ht 5\' 8"  (1.727 m)  Wt 71.305 kg (157 lb 3.2 oz)  BMI 23.90 kg/m2  SpO2 98%  PHYSICAL EXAM: Pt is alert and oriented, NAD HEENT: normal Neck: JVP - normal, carotids 2+= without bruits Lungs: CTA bilaterally CV: RRR without murmur or gallop Abd: soft, NT, Positive BS, no hepatomegaly Ext: no C/C/E, distal pulses intact and equal Skin: warm/dry no rash  EKG:  Normal sinus rhythm 71 beats per minute, left atrial enlargement, otherwise within normal limits.  ASSESSMENT AND PLAN:  1. Coronary  artery disease, native vessel. The patient continues to have CCS class II angina. It is difficult to sort out her symptoms as she has had a cardiac catheterization showing only mild nonobstructive disease and a nuclear stress test 6 months ago without significant ischemia. I think it is best to continue with medical management. She is greater than 12 months out from her last PCI procedure and I think we should stop brilinta at this point, especially considering her episodic bleeding.  2. Essential hypertension. Home blood pressures have been ranging in the 90s. I suspect with her significant weight loss she is experiencing lightheadedness and dizziness because of hypotension. I have recommended stopping isosorbide and metoprolol. She will notify us if there are any changes in her symptoms with discontinuation of these medicines. She may start to have to add a little more salt to her food.  3. Hyperlipidemia. With mildly increased LFTs about 6 months ago, will repeat a lipid and LFT panel.   4. Dizziness and lightheadedness. Will check labs and make medication changes as above. We'll check a CBC to rule out anemia.  For followup, I would like to see her back in 6 months.

## 2012-08-04 ENCOUNTER — Ambulatory Visit: Payer: PRIVATE HEALTH INSURANCE | Admitting: Cardiovascular Disease

## 2012-10-11 ENCOUNTER — Ambulatory Visit (INDEPENDENT_AMBULATORY_CARE_PROVIDER_SITE_OTHER): Payer: BC Managed Care – PPO

## 2012-10-11 ENCOUNTER — Telehealth: Payer: Self-pay | Admitting: Cardiovascular Disease

## 2012-10-11 VITALS — BP 110/60 | HR 68 | Wt 154.8 lb

## 2012-10-11 DIAGNOSIS — R002 Palpitations: Secondary | ICD-10-CM

## 2012-10-11 NOTE — Telephone Encounter (Signed)
I spoke with the pt and she complains of "skipped beats" that have been constant for the past week.  The pt denies SOB but does complain of left shoulder blade and arm pain.  The pt went to her chiropractor last week due to pain and when he checked the BP he noticed an irregular pulse and recommended that she contact cardiology. The pt will come into the office today for an EKG.

## 2012-10-11 NOTE — Telephone Encounter (Signed)
plz return call to pt [phone] regarding heart skipping beats, no SOB, chest pain at this time.  Pt is concerned as this has been going on for the last week.  She reports this is not an emergent matter.

## 2012-10-11 NOTE — Progress Notes (Signed)
The pt came into the office today at 12:00 for an EKG. EKG showed NSR with frequent PVCs. BP 110/60, pulse 68.  The pt called into the office complaining of skipped beats. The pt does drink decaffeinated green tea. I made the pt aware that she should limit all caffeine intake. The pt is currently on her menstrual cycle and does notice that her palpitations are worse with her cycle. The pt will continue her current medications.  I will place the pt's EKG in Dr Earmon Phoenix folder for review.

## 2012-11-26 ENCOUNTER — Other Ambulatory Visit: Payer: Self-pay

## 2012-12-11 ENCOUNTER — Other Ambulatory Visit: Payer: Self-pay | Admitting: Cardiovascular Disease

## 2012-12-26 ENCOUNTER — Other Ambulatory Visit: Payer: Self-pay | Admitting: Cardiovascular Disease

## 2013-05-01 ENCOUNTER — Ambulatory Visit (INDEPENDENT_AMBULATORY_CARE_PROVIDER_SITE_OTHER): Payer: BC Managed Care – PPO | Admitting: Cardiovascular Disease

## 2013-05-01 ENCOUNTER — Encounter: Payer: Self-pay | Admitting: Cardiovascular Disease

## 2013-05-01 VITALS — BP 122/84 | HR 60 | Ht 68.0 in | Wt 152.0 lb

## 2013-05-01 DIAGNOSIS — E7800 Pure hypercholesterolemia, unspecified: Secondary | ICD-10-CM

## 2013-05-01 DIAGNOSIS — I2510 Atherosclerotic heart disease of native coronary artery without angina pectoris: Secondary | ICD-10-CM

## 2013-05-01 NOTE — Progress Notes (Signed)
HPI:  47 year old woman presenting for followup evaluation. The patient has coronary artery disease and initially presented in 2011 with a non-ST elevation MI. She underwent PCI of a critical stenosis in her proximal LAD. She had recurrent unstable angina and had a new lesion in the distal right coronary artery in 2012 treated again with a drug-eluting stent. She had a followup cardiac catheterization after again developing chest pain and this demonstrated patency of both of her stent sites. Her last stress test was in April 2013 and it demonstrated normal left ventricular function with the gated ejection fraction of 73% and normal perfusion throughout. Last lipids from October 2013 showed a cholesterol of 125, triglycerides 80, HDL 57, and LDL 52. Liver function tests were within normal limits. Renal function is normal.  She feels well at present. She has some palpitations and chest discomfort that occur during her menstrual cycle. She does not have any regular exertional chest pain or pressure. She denies dyspnea, edema, orthopnea, or PND. She stopped taking Crestor several months ago because of leg pain. She's only taking aspirin at the current time.   Outpatient Encounter Prescriptions as of 05/01/2013  Medication Sig Dispense Refill  . aspirin 81 MG tablet Take 81 mg by mouth daily.      . Probiotic Product (PROBIOTIC DAILY PO) Take by mouth daily.      . [DISCONTINUED] acetaminophen (TYLENOL) 650 MG CR tablet Take 650 mg by mouth every 8 (eight) hours as needed.        . [DISCONTINUED] aspirin 81 MG tablet Take 81 mg by mouth daily.        . [DISCONTINUED] CRESTOR 40 MG tablet TAKE 1 TABLET BY MOUTH DAILY  30 tablet  6  . [DISCONTINUED] Lysine Acetate 500 MG TABS Take 1 tablet by mouth daily.        . [DISCONTINUED] Multiple Vitamin (MULTIVITAMIN) tablet Take 1 tablet by mouth daily.        . [DISCONTINUED] nitroGLYCERIN (NITROSTAT) 0.4 MG SL tablet Place 1 tablet (0.4 mg total) under the  tongue every 5 (five) minutes as needed.  25 tablet  3  . [DISCONTINUED] pantoprazole (PROTONIX) 40 MG tablet TAKE 1 TABLET BY MOUTH DAILY  30 tablet  6   No facility-administered encounter medications on file as of 05/01/2013.    Allergies  Allergen Reactions  . Adhesive (Tape)   . Latex   . Penicillins   . Sulfonamide Derivatives     Past Medical History  Diagnosis Date  . CAD (coronary artery disease)     NSTEMI in 2011 treated with DES to the LAD;    Cardiac cath 7/20: Mid RCA ectatic, proximal and mid RCA 30-40%, distal RCA 80-90%, left main 20-30%, proximal LAD stent patent with 30-40% after the stent, D1 ostial 50%, pCFX 50%;  Status post DES to the RCA 05/01/11  . Diverticulitis   . Dyslipidemia   . Gastroesophageal reflux disease     ROS: Negative except as per HPI  BP 122/84  Pulse 60  Ht 5\' 8"  (1.727 m)  Wt 152 lb (68.947 kg)  BMI 23.12 kg/m2  PHYSICAL EXAM: Pt is alert and oriented, NAD HEENT: normal Neck: JVP - normal, carotids 2+= without bruits Lungs: CTA bilaterally CV: RRR without murmur or gallop Abd: soft, NT, Positive BS, no hepatomegaly Ext: no C/C/E, distal pulses intact and equal Skin: warm/dry no rash  EKG:  Sinus rhythm 60 beats per minute, occasional PVCs, otherwise within normal limits.  ASSESSMENT AND PLAN: 1. Coronary artery disease, native vessel. She only has periodic anginal symptoms. She's had a low risk stress Myoview scan and overall has done well with medical management. However, at the present time she is on minimal therapy with only aspirin. I'm not inclined to add any antianginal drugs at the present time as she has minimal symptoms.  2. Hypertension. She has lost a good bit of weight and her blood pressure is controlled without any medication.  3. Hyperlipidemia. She's been intolerant to high-dose Crestor. I am going to repeat lipids and LFTs. I think she will need to be on at least a low dose of a statin drug considering her  premature atherosclerotic disease.  [name] 05/01/2013 11:08 AM

## 2013-05-01 NOTE — Patient Instructions (Addendum)
Your physician recommends that you return for a FASTING Lipid and Liver profile--nothing to eat or drink after midnight, lab opens at 7:30.  Your physician wants you to follow-up in: 1 YEAR with Dr Excell Seltzer. You will receive a reminder letter in the mail two months in advance. If you don't receive a letter, please call our office to schedule the follow-up appointment.  Your physician recommends that you continue on your current medications as directed. Please refer to the Current Medication list given to you today.

## 2013-05-02 ENCOUNTER — Other Ambulatory Visit (INDEPENDENT_AMBULATORY_CARE_PROVIDER_SITE_OTHER): Payer: BC Managed Care – PPO

## 2013-05-02 DIAGNOSIS — I2510 Atherosclerotic heart disease of native coronary artery without angina pectoris: Secondary | ICD-10-CM

## 2013-05-02 DIAGNOSIS — E7800 Pure hypercholesterolemia, unspecified: Secondary | ICD-10-CM

## 2013-05-02 LAB — HEPATIC FUNCTION PANEL
ALT: 16 U/L (ref 0–35)
AST: 20 U/L (ref 0–37)
Alkaline Phosphatase: 45 U/L (ref 39–117)
Bilirubin, Direct: 0 mg/dL (ref 0.0–0.3)
Total Bilirubin: 0.8 mg/dL (ref 0.3–1.2)

## 2013-05-02 LAB — LIPID PANEL: Triglycerides: 54 mg/dL (ref 0.0–149.0)

## 2013-05-03 ENCOUNTER — Other Ambulatory Visit: Payer: Self-pay

## 2013-05-03 DIAGNOSIS — E7800 Pure hypercholesterolemia, unspecified: Secondary | ICD-10-CM

## 2013-05-03 MED ORDER — ROSUVASTATIN CALCIUM 10 MG PO TABS: 10.0000 mg | ORAL_TABLET | Freq: Every day | ORAL | Status: DC

## 2013-06-28 ENCOUNTER — Encounter: Payer: Self-pay | Admitting: Obstetrics and Gynecology

## 2013-06-28 ENCOUNTER — Ambulatory Visit (INDEPENDENT_AMBULATORY_CARE_PROVIDER_SITE_OTHER): Payer: BC Managed Care – PPO | Admitting: Obstetrics and Gynecology

## 2013-06-28 VITALS — BP 120/82 | Ht 68.0 in | Wt 150.0 lb

## 2013-06-28 DIAGNOSIS — N898 Other specified noninflammatory disorders of vagina: Secondary | ICD-10-CM

## 2013-06-28 DIAGNOSIS — Z01419 Encounter for gynecological examination (general) (routine) without abnormal findings: Secondary | ICD-10-CM

## 2013-06-28 DIAGNOSIS — I2510 Atherosclerotic heart disease of native coronary artery without angina pectoris: Secondary | ICD-10-CM

## 2013-06-28 LAB — POCT WET PREP (WET MOUNT)

## 2013-06-28 LAB — POCT OCCULT BLOOD STOOL, DEVICE: NEG CONTROL: NEGATIVE

## 2013-06-28 NOTE — Progress Notes (Signed)
  Assessment:  Annual Gyn Exam  s/p pelvic u/s march for abd pain eval., now cyclic premenses. Symptomatic Rectocele Plan:  1. pap smear done, next pap due 56yr. 2. return annually or prn 3    Annual mammogram advised 4. F/u discus Rectocele repair after internet info search Subjective:  Dawn Washington is a 47 y.o. female G2 P2. No obstetric history on file. who presents for annual exam. Patient's last menstrual period was 05/28/2013. The patient has complaints today of none  The following portions of the patient's history were reviewed and updated as appropriate: allergies, current medications, past family history, past medical history, past social history, past surgical history and problem list.  S/p coronary stent x 2  Review of Systems Constitutional: positive for hx coronary probs Gastrointestinal: negative, slight abd pain, premenstrual Genitourinary: No UI, mild sui. With cough.  Objective:  BP 120/82  Ht 5\' 8"  (1.727 m)  Wt 150 lb (68.04 kg)  BMI 22.81 kg/m2  LMP 05/28/2013   BMI: Body mass index is 22.81 kg/(m^2).  General Appearance: Alert, appropriate appearance for age. No acute distress HEENT: Grossly normal Neck / Thyroid:  Cardiovascular: RRR; normal S1, S2, no murmur Lungs: CTA bilaterally Back: No CVAT Breast Exam: mammo needed and No masses or nodes.No dimpling, nipple retraction or discharge. Gastrointestinal: Soft, non-tender, no masses or organomegaly Pelvic Exam: Vulva and vagina appear normal. Bimanual exam reveals normal uterus and adnexa. External genitalia: normal general appearance Rectovaginal:,  and rectocele noted tiny low, SYMPTOMATIC Lymphatic Exam: Non-palpable nodes in neck, clavicular, axillary, or inguinal regions Skin: no rash or abnormalities Neurologic: Normal gait and speech, no tremor  Psychiatric: Alert and oriented, appropriate affect.  Urinalysis:Not done  Christin Bach. MD Pgr [phone] 12:17 PM

## 2013-06-30 ENCOUNTER — Other Ambulatory Visit: Payer: Self-pay

## 2013-06-30 DIAGNOSIS — Z1231 Encounter for screening mammogram for malignant neoplasm of breast: Secondary | ICD-10-CM

## 2013-07-03 ENCOUNTER — Telehealth: Payer: Self-pay | Admitting: Obstetrics and Gynecology

## 2013-07-03 NOTE — Telephone Encounter (Signed)
Pt states had bacterial infection at last visit Dr. Emelda Fear was to e-scribe med but pharmacy states does not have RX. Pt unsure of medication name.

## 2013-07-12 MED ORDER — METRONIDAZOLE 500 MG PO TABS: 500.0000 mg | ORAL_TABLET | Freq: Two times a day (BID) | ORAL | Status: DC

## 2013-07-12 NOTE — Telephone Encounter (Signed)
E-Rx of metronidazole to drug stor completed

## 2013-07-12 NOTE — Telephone Encounter (Signed)
escribe completed. Pt made aware.

## 2013-07-14 ENCOUNTER — Ambulatory Visit
Admission: RE | Admit: 2013-07-14 | Discharge: 2013-07-14 | Disposition: A | Discharge status: Home / Self Care | Payer: BC Managed Care – PPO | Source: Ambulatory Visit

## 2013-07-14 DIAGNOSIS — Z1231 Encounter for screening mammogram for malignant neoplasm of breast: Secondary | ICD-10-CM

## 2013-07-17 ENCOUNTER — Telehealth: Payer: Self-pay | Admitting: Cardiovascular Disease

## 2013-07-17 ENCOUNTER — Ambulatory Visit (INDEPENDENT_AMBULATORY_CARE_PROVIDER_SITE_OTHER): Payer: BC Managed Care – PPO | Admitting: Obstetrics and Gynecology

## 2013-07-17 ENCOUNTER — Encounter: Payer: Self-pay | Admitting: Obstetrics and Gynecology

## 2013-07-17 VITALS — BP 120/88 | Ht 68.0 in | Wt 147.8 lb

## 2013-07-17 DIAGNOSIS — E7800 Pure hypercholesterolemia, unspecified: Secondary | ICD-10-CM

## 2013-07-17 DIAGNOSIS — N816 Rectocele: Secondary | ICD-10-CM | POA: Insufficient documentation

## 2013-07-17 DIAGNOSIS — Z01818 Encounter for other preprocedural examination: Secondary | ICD-10-CM

## 2013-07-17 MED ORDER — ROSUVASTATIN CALCIUM 10 MG PO TABS: 10.0000 mg | ORAL_TABLET | Freq: Every day | ORAL | Status: DC

## 2013-07-17 NOTE — Telephone Encounter (Signed)
Pt scheduled for surgery on 08/08/13 with Dr Emelda Fear at [facility]: Symptomatic rectocele above intact anal sphincter, normal bladder and uterine support will not require anterior repair or hysterectomy  Patient desires to proceed toward rectocele repair, tentatively schedule end of this month  I will forward this message to Dr Excell Seltzer to address surgical clearance.  The pt denies any change or worsening in anginal symptoms.

## 2013-07-17 NOTE — Progress Notes (Signed)
Patient ID: Dawn Washington, female   DOB: Jan 16, 1966, 47 y.o.   MRN: [id number]   [facility] ObGyn Clinic Visit  Patient name: Dawn Washington MRN [id number]  Date of birth: 09/08/1966  CC & HPI:  Dawn Washington is a 47 y.o. female presenting today for  discussion and scheduling a rectocele repair, for symptomatic rectocele that has been present since her delivery of her second child 20 some years ago. At this point her life it requires constant splinting of the perineum in order to defecate.. Patient denies anal incontinence.  ROS:  Negative for stress incontinence negative for dyspareunia. Patient does note that she is having vasomotor symptoms and periods are becoming irregular without intermenstrual bleeding, simply less frequent  Pertinent History Reviewed:  Medical & Surgical Hx:  Reviewed: Significant for delivery her second child was notable for an episiotomy that was followed by "tearing all the way through". At this time the sphincter is intact but a large rectocele exists just above the anal sphincter Medical history positive for heart disease having required stents at age 76, followed by Dr. Excell Seltzer of [facility] cardiology Medications: Reviewed & Updated - see associated section Social History: Reviewed -  reports that she quit smoking about 11 years ago. She has never used smokeless tobacco.  Objective Findings:  Vitals: BP 120/88  Ht 5\' 8"  (1.727 m)  Wt 147 lb 12.8 oz (67.042 kg)  BMI 22.48 kg/m2  LMP 05/28/2013  Physical Examination: General appearance - alert, well appearing, and in no distress, oriented to person, place, and time and normal appearing weight Mental status - alert, oriented to person, place, and time Eyes - pupils equal and reactive, extraocular eye movements intact Abdomen - soft, nontender, nondistended, no masses or organomegaly Well-healed umbilical incision status post laparoscopic cholecystectomy and tubal ligation Pelvic - VULVA: normal appearing vulva with  no masses, tenderness or lesions, snug introitus , VAGINA: normal appearing vagina with normal color and discharge, no lesions, PELVIC FLOOR EXAM: rectocele above intact sphincter, very thin rectovaginal septum in mid portion of vagina , CERVIX: normal appearing cervix without discharge or lesions, UTERUS: uterus is normal size, shape, consistency and nontender, ADNEXA: normal adnexa in size, nontender and no masses, RECTAL: rectocele noted , as above Rectal - a large rectocele exists above the anal sphincter which is intact and with good tone   Assessment & Plan:   Symptomatic rectocele above intact anal sphincter, normal bladder and uterine support will not require anterior repair or hysterectomy Patient desires to proceed toward rectocele repair, tentatively schedule end of this month

## 2013-07-17 NOTE — Telephone Encounter (Signed)
Received request for cardiac clearance for surgery. The patient will undergo repair of a rectocele. She was seen in the office 05/01/2013. She reports no changes in her cardiac symptoms since that time. She can proceed with surgery at low risk of cardiac complications. I would be happy to see her if any problems arise.  [name] 07/17/2013 1:17 PM

## 2013-07-17 NOTE — Telephone Encounter (Signed)
I spoke with the pt and made her aware of Dr Earmon Phoenix comments. I will forward this message to Dr Emelda Fear and Samule Dry.

## 2013-07-17 NOTE — Patient Instructions (Signed)
Will arrange surgery later this month operating room scheduler is Samule Dry here at our office

## 2013-07-17 NOTE — Telephone Encounter (Signed)
New problem    Surgery on 10/28. Please advise on cardiac clearance from last office visit in July.

## 2013-07-18 LAB — GC/CHLAMYDIA PROBE AMP: CT Probe RNA: NEGATIVE

## 2013-07-24 ENCOUNTER — Encounter (HOSPITAL_COMMUNITY): Payer: Self-pay | Admitting: Pharmacy Technician

## 2013-07-26 ENCOUNTER — Other Ambulatory Visit: Payer: Self-pay | Admitting: Obstetrics and Gynecology

## 2013-07-26 NOTE — H&P (Signed)
  Patient ID: Dawn Washington, female DOB: 25-Jul-1966, 47 y.o. MRN: [id number]  [facility] ObGyn Clinic Visit   Patient name: Dawn Washington MRN [id number] Date of birth: [birth year]  CC & HPI:   [name] is a 47 y.o. female presenting today for discussion and scheduling a rectocele repair, for symptomatic rectocele that has been present since her delivery of her second child 20 some years ago. At this point her life it requires constant splinting of the perineum in order to defecate.. Patient denies anal incontinence.  We have discussed consideration of any additonal surgeries at this time , such as hysterectomy , and patient is stable without uterine concerns at this time.  She is aware that posterior repair, while beneficial for current condition, will make consideration of future vaginal surgeries such as vag hyst, more difficult. ROS:   Negative for stress incontinence negative for dyspareunia. Patient does note that she is having vasomotor symptoms and periods are becoming irregular without intermenstrual bleeding, simply less frequent  Pertinent History Reviewed:   Medical & Surgical Hx: Reviewed: Significant for delivery her second child was notable for an episiotomy that was followed by "tearing all the way through". At this time the sphincter is intact but a large rectocele exists just above the anal sphincter  Medical history positive for heart disease having required stents at age 38, followed by Dr. Excell Seltzer of [facility] cardiology  Medications: Reviewed & Updated - see associated section  Social History: Reviewed - reports that she quit smoking about 11 years ago. She has never used smokeless tobacco.  Objective Findings:   Vitals: BP 120/88  Ht 5\' 8"  (1.727 m)  Wt 147 lb 12.8 oz (67.042 kg)  BMI 22.48 kg/m2  LMP 05/28/2013  Physical Examination: General appearance - alert, well appearing, and in no distress, oriented to person, place, and time and normal appearing weight  Mental status  - alert, oriented to person, place, and time  Eyes - pupils equal and reactive, extraocular eye movements intact  Abdomen - soft, nontender, nondistended, no masses or organomegaly  Well-healed umbilical incision status post laparoscopic cholecystectomy and tubal ligation  Pelvic - VULVA: normal appearing vulva with no masses, tenderness or lesions, snug introitus  , VAGINA: normal appearing vagina with normal color and discharge, no lesions, PELVIC FLOOR EXAM: rectocele above intact sphincter, very thin rectovaginal septum in mid portion of vagina  , CERVIX: normal appearing cervix without discharge or lesions, UTERUS: uterus is normal size, shape, consistency and nontender, ADNEXA: normal adnexa in size, nontender and no masses, RECTAL: rectocele noted , as above  Rectal - a large rectocele exists above the anal sphincter which is intact and with good tone  Assessment & Plan:   Symptomatic rectocele above intact anal sphincter, normal bladder and uterine support will not require anterior repair or hysterectomy  Patient desires to proceed toward rectocele repair, tentatively scheduled for 08 Aug 2013.  Consent from Cardiology obtained , see their note.

## 2013-08-01 NOTE — Patient Instructions (Addendum)
Dawn Washington  08/01/2013   Your procedure is scheduled on:  Tuesday, October 28  Report to [facility] at [phone] AM.  Call this number if you have problems the morning of surgery: [phone]   Remember:   Do not eat food after 1200 Noon Monday or drink liquids after midnight.   Take these medicines the morning of surgery with A SIP OF WATER: No medications required morning of surgery.   Do not wear jewelry, make-up or nail polish.  Do not wear lotions, powders, or perfumes. You may wear deodorant.  Do not shave 48 hours prior to surgery. Men may shave face and neck.  Do not bring valuables to the hospital.  [facility] is not responsible for any belongings or valuables.               Contacts, dentures or bridgework may not be worn into surgery.  Leave suitcase in the car. After surgery it may be brought to your room.  For patients admitted to the hospital, discharge time is determined by your treatment team.               Patients discharged the day of surgery will not be allowed to drive  home.  Name and phone number of your driver: Family or friend  Special Instructions: Shower using CHG 2 nights before surgery and the night before surgery.  If you shower the day of surgery use CHG.  Use special wash - you have one bottle of CHG for all showers.  You should use approximately 1/3 of the bottle for each shower.   Please read over the following fact sheets that you were given: Pain Booklet, Coughing and Deep Breathing, MRSA Information, Surgical Site Infection Prevention, Anesthesia Post-op Instructions and Care and Recovery After Surgery  PATIENT INSTRUCTIONS POST-ANESTHESIA  IMMEDIATELY FOLLOWING SURGERY:  Do not drive or operate machinery for the first twenty four hours after surgery.  Do not make any important decisions for twenty four hours after surgery or while taking narcotic pain medications or sedatives.  If you develop intractable nausea and vomiting or a severe headache  please notify your doctor immediately.  FOLLOW-UP:  Please make an appointment with your surgeon as instructed. You do not need to follow up with anesthesia unless specifically instructed to do so.  WOUND CARE INSTRUCTIONS (if applicable):  Keep a dry clean dressing on the anesthesia/puncture wound site if there is drainage.  Once the wound has quit draining you may leave it open to air.  Generally you should leave the bandage intact for twenty four hours unless there is drainage.  If the epidural site drains for more than 36-48 hours please call the anesthesia department.  QUESTIONS?:  Please feel free to call your physician or the hospital operator if you have any questions, and they will be happy to assist you.     Rectocele/Enterocele Care After A woman's birth canal (vagina) can become weak or stretched. This can be caused by childbirth, heavy lifting, lasting (chronic) constipation, aging, or pelvic surgery. When the vagina is weak and stretched, parts of the intestine can bulge into the vagina by pushing against the vaginal walls. A rectocele is when the very end of the large intestine (rectum) causes the bulge. An enterocele is when the small intestine causes the bulge. Surgery to fix this problem is usually done through the vagina. If you just had this surgery, you were probably given a drug to make you sleep (general anesthetic)  or a drug that numbs you from the waist down (spinal/epidural). Here is what happened:  The small intestine or rectum was pushed back to its normal place.  The vaginal wall was made stronger. Sometimes this is done with stitches or a mesh-like material. HOME CARE INSTRUCTIONS  Some women go home the same day as their surgery. Others stay in the hospital for a few days. This depends on the size and type of repair.  Pain and Medications  Some pain is normal after this surgery. Only take pain medicine your surgeon prescribed. Follow the directions carefully.  Do  not take aspirin. It can cause bleeding.  Do not drink alcohol while taking pain medication.  You may be given a medicine (antibiotic) that kills germs. Follow the directions carefully.  Take warm sitz baths 2 times a day to control discomfort and reduce any swelling. Take sitz baths with your caregiver's permission. Diet  Go back to your normal eating as directed by your caregiver.  Drink a lot of fluids. Drink at least 6 glasses of water every day. Activity  Move around and walk as much as possible. This can keep blood clots from forming in your legs.  Do not climb stairs until your caregiver says it is okay.  Do not lift objects 5 pounds (2.3 kg) or heavier. Do not bend or strain for 6 to 8 weeks.  Do not drive until after you stop taking pain medicine and your caregiver says it is okay.  Your return to work will depend on the type of work you do. Ask your caregiver what is best for you.  Ask your caregiver when you can resume sexual activity. Most women can start having sex in about 6 weeks after their surgery.  Get plenty of rest during the day and sleep at night.  Have someone help you with your household chores and activities for 3 to 4 weeks. Other Precautions  You may have some discharge from the vagina for a few weeks after the surgery. It may have small amounts of blood in it. This is normal. If you have questions, ask your caregiver.  Do not use tampons or douche.  You should be able to take a shower a day after your surgery. Do not take a tub bath for at least a week.  Take it easy for awhile. You should feel much better in 2 to 3 weeks. It may take up to 6 weeks to feel completely normal.  Keep all follow-up appointments.  Take your temperature twice a day and write it down.  Make sure your family understands everything about your surgery and recovery. SEEK MEDICAL CARE IF:   You have any questions about your medication, or you need stronger pain  medication.  Pain continues, even after taking pain medication.  You become constipated.  You have an oral temperature above 102 F (38.9 C).  You develop swelling and redness in the surgery area.  You become dizzy or lightheaded.  You feel sick to your stomach (nauseous), throw up (vomit), or have diarrhea.  You develop a rash.  You have a reaction to your medications. SEEK IMMEDIATE MEDICAL CARE IF:   Pain gets worse.  You have new bleeding from your vagina.  Discharge from the vagina becomes heavy, or it has a bad smell.  You have an oral temperature above 102 F (38.9 C), not controlled by medicine.  You develop belly (abdominal) pain.  You develop chest pain.  You develop shortness  of breath.  You pass out (faint).  You develop pain, swelling, or redness in the leg.  You have pain or burning with urination.  You have bloody urine or cannot urinate. MAKE SURE YOU:   Understand these instructions.  Will watch your condition.  Will get help right away if you are not doing well or get worse. Document Released: 12/23/2009 Document Revised: 12/21/2011 Document Reviewed: 12/23/2009 [facility] Patient Information 2014 [place], [place].

## 2013-08-02 ENCOUNTER — Encounter (HOSPITAL_COMMUNITY)
Admission: RE | Admit: 2013-08-02 | Discharge: 2013-08-02 | Disposition: A | Discharge status: Home / Self Care | Payer: BC Managed Care – PPO | Source: Ambulatory Visit | Attending: Obstetrics and Gynecology | Admitting: Obstetrics and Gynecology

## 2013-08-02 ENCOUNTER — Encounter (HOSPITAL_COMMUNITY): Payer: Self-pay

## 2013-08-02 ENCOUNTER — Telehealth: Payer: Self-pay | Admitting: Cardiovascular Disease

## 2013-08-02 DIAGNOSIS — Z01812 Encounter for preprocedural laboratory examination: Secondary | ICD-10-CM | POA: Insufficient documentation

## 2013-08-02 HISTORY — DX: Acute myocardial infarction, unspecified: I21.9

## 2013-08-02 LAB — COMPREHENSIVE METABOLIC PANEL
AST: 38 U/L — ABNORMAL HIGH (ref 0–37)
Albumin: 4.2 g/dL (ref 3.5–5.2)
Alkaline Phosphatase: 59 U/L (ref 39–117)
BUN: 7 mg/dL (ref 6–23)
Calcium: 9.9 mg/dL (ref 8.4–10.5)
Chloride: 101 mEq/L (ref 96–112)
Creatinine, Ser: 0.66 mg/dL (ref 0.50–1.10)
Total Bilirubin: 0.4 mg/dL (ref 0.3–1.2)
Total Protein: 7.5 g/dL (ref 6.0–8.3)

## 2013-08-02 LAB — CBC
HCT: 43.5 % (ref 36.0–46.0)
MCH: 30.1 pg (ref 26.0–34.0)
MCHC: 33.1 g/dL (ref 30.0–36.0)
MCV: 90.8 fL (ref 78.0–100.0)
Platelets: 239 10*3/uL (ref 150–400)
RDW: 12.7 % (ref 11.5–15.5)
WBC: 7.1 10*3/uL (ref 4.0–10.5)

## 2013-08-02 LAB — HCG, SERUM, QUALITATIVE: Preg, Serum: NEGATIVE

## 2013-08-02 LAB — RPR: RPR Ser Ql: NONREACTIVE

## 2013-08-02 NOTE — Telephone Encounter (Signed)
Per Dr Excell Seltzer the pt can hold ASA prior to surgery.  I spoke with the pt and made her aware of this information.

## 2013-08-02 NOTE — Telephone Encounter (Signed)
I spoke with the pt and she is scheduled for surgery on 08/08/13 (pt has already been cleared from a cardiac standpoint).  They have asked the pt to stop ASA starting today and she would like to know if this is okay. The pt also had pre-op lab work drawn today and she is scheduled to come into the office tomorrow for a lipid and liver.  A CMP was checked today but a Lipid panel was not done. I made the pt aware that she would still have to have lipid panel drawn. I will ask Dr Excell Seltzer if the pt can hold ASA and call her back.

## 2013-08-02 NOTE — Telephone Encounter (Signed)
New Problem:  Pt states she is having surgery next Tuesday. Pt states they want her to stop her aspirin. Pt wants to know if that's a problem? Please advise  Pt also states she wants to know if her labs orders for tomorrow can be done today with her pre-surgery testing. Please advise

## 2013-08-02 NOTE — Progress Notes (Signed)
1030 Asked Dr. Jayme Cloud about patient taking Aspirin 81 mg daily before surgery. Ordered to stop taking Aspirin 81 mg if all right with her cardiologist. Patient to contact [facility] Short Stay, if cardiologist does not want her to discontinue before surgery. Patient verbalized understanding.

## 2013-08-03 ENCOUNTER — Other Ambulatory Visit (INDEPENDENT_AMBULATORY_CARE_PROVIDER_SITE_OTHER): Payer: BC Managed Care – PPO

## 2013-08-03 DIAGNOSIS — E7800 Pure hypercholesterolemia, unspecified: Secondary | ICD-10-CM

## 2013-08-03 LAB — LIPID PANEL
Cholesterol: 187 mg/dL (ref 0–200)
HDL: 80.1 mg/dL (ref >39.00)
Total CHOL/HDL Ratio: 2
VLDL: 14 mg/dL (ref 0.0–40.0)

## 2013-08-08 ENCOUNTER — Encounter (HOSPITAL_COMMUNITY): Payer: BC Managed Care – PPO | Admitting: Anesthesiology

## 2013-08-08 ENCOUNTER — Encounter (HOSPITAL_COMMUNITY)
Admission: RE | Disposition: A | Discharge status: Home / Self Care | Payer: Self-pay | Source: Ambulatory Visit | Attending: Obstetrics and Gynecology

## 2013-08-08 ENCOUNTER — Encounter (HOSPITAL_COMMUNITY): Payer: Self-pay | Admitting: *Deleted

## 2013-08-08 ENCOUNTER — Ambulatory Visit (HOSPITAL_COMMUNITY): Payer: BC Managed Care – PPO | Admitting: Anesthesiology

## 2013-08-08 ENCOUNTER — Ambulatory Visit (HOSPITAL_COMMUNITY)
Admission: RE | Admit: 2013-08-08 | Discharge: 2013-08-08 | Disposition: A | Discharge status: Home / Self Care | Payer: BC Managed Care – PPO | Source: Ambulatory Visit | Attending: Obstetrics and Gynecology | Admitting: Obstetrics and Gynecology

## 2013-08-08 DIAGNOSIS — N816 Rectocele: Secondary | ICD-10-CM | POA: Insufficient documentation

## 2013-08-08 DIAGNOSIS — Z7982 Long term (current) use of aspirin: Secondary | ICD-10-CM | POA: Insufficient documentation

## 2013-08-08 HISTORY — PX: RECTOCELE REPAIR: SHX761

## 2013-08-08 SURGERY — COLPORRHAPHY, POSTERIOR, FOR RECTOCELE REPAIR
Anesthesia: General | Site: Vagina | Wound class: Clean Contaminated

## 2013-08-08 MED ORDER — MIDAZOLAM HCL 5 MG/5ML IJ SOLN
INTRAMUSCULAR | Status: DC | PRN
  Administered 2013-08-08: 2 mg via INTRAVENOUS

## 2013-08-08 MED ORDER — FENTANYL CITRATE 0.05 MG/ML IJ SOLN
INTRAMUSCULAR | Status: AC
  Filled 2013-08-08: qty 2

## 2013-08-08 MED ORDER — MIDAZOLAM HCL 2 MG/2ML IJ SOLN
INTRAMUSCULAR | Status: AC
  Filled 2013-08-08: qty 2

## 2013-08-08 MED ORDER — FENTANYL CITRATE 0.05 MG/ML IJ SOLN
INTRAMUSCULAR | Status: AC
  Filled 2013-08-08: qty 5

## 2013-08-08 MED ORDER — LIDOCAINE HCL 1 % IJ SOLN
INTRAMUSCULAR | Status: DC | PRN
  Administered 2013-08-08: 50 mg via INTRADERMAL

## 2013-08-08 MED ORDER — FENTANYL CITRATE 0.05 MG/ML IJ SOLN
INTRAMUSCULAR | Status: DC | PRN
  Administered 2013-08-08: 50 ug via INTRAVENOUS

## 2013-08-08 MED ORDER — PROPOFOL 10 MG/ML IV BOLUS
INTRAVENOUS | Status: DC | PRN
  Administered 2013-08-08: 120 mg via INTRAVENOUS

## 2013-08-08 MED ORDER — ONDANSETRON HCL 4 MG/2ML IJ SOLN: 4.0000 mg | Freq: Once | INTRAMUSCULAR | Status: DC | PRN

## 2013-08-08 MED ORDER — OXYCODONE-ACETAMINOPHEN 5-325 MG PO TABS: 1.0000 | ORAL_TABLET | ORAL | Status: DC | PRN

## 2013-08-08 MED ORDER — FENTANYL CITRATE 0.05 MG/ML IJ SOLN: 25.0000 ug | INTRAMUSCULAR | Status: DC | PRN

## 2013-08-08 MED ORDER — BUPIVACAINE-EPINEPHRINE 0.5% -1:200000 IJ SOLN
INTRAMUSCULAR | Status: DC | PRN
  Administered 2013-08-08: 20 mL

## 2013-08-08 MED ORDER — OXYCODONE-ACETAMINOPHEN 5-325 MG PO TABS: 1.0000 | ORAL_TABLET | Freq: Once | ORAL | Status: DC

## 2013-08-08 MED ORDER — FENTANYL CITRATE 0.05 MG/ML IJ SOLN
25.0000 ug | INTRAMUSCULAR | Status: AC
  Administered 2013-08-08 (×2): 25 ug via INTRAVENOUS

## 2013-08-08 MED ORDER — LACTATED RINGERS IV SOLN
INTRAVENOUS | Status: DC
  Administered 2013-08-08: 1000 mL via INTRAVENOUS

## 2013-08-08 MED ORDER — BUPIVACAINE-EPINEPHRINE PF 0.5-1:200000 % IJ SOLN
INTRAMUSCULAR | Status: AC
  Filled 2013-08-08: qty 10

## 2013-08-08 MED ORDER — MIDAZOLAM HCL 2 MG/2ML IJ SOLN
1.0000 mg | INTRAMUSCULAR | Status: DC | PRN
  Administered 2013-08-08: 2 mg via INTRAVENOUS

## 2013-08-08 MED ORDER — PROPOFOL 10 MG/ML IV BOLUS
INTRAVENOUS | Status: AC
  Filled 2013-08-08: qty 20

## 2013-08-08 MED ORDER — CEFAZOLIN SODIUM-DEXTROSE 2-3 GM-% IV SOLR
2.0000 g | INTRAVENOUS | Status: AC
  Administered 2013-08-08: 2 g via INTRAVENOUS
  Filled 2013-08-08: qty 50

## 2013-08-08 MED ORDER — LIDOCAINE HCL (PF) 1 % IJ SOLN
INTRAMUSCULAR | Status: AC
  Filled 2013-08-08: qty 5

## 2013-08-08 MED ORDER — SODIUM CHLORIDE 0.9 % IR SOLN
Status: DC | PRN
  Administered 2013-08-08: 500 mL

## 2013-08-08 SURGICAL SUPPLY — 31 items
BAG HAMPER (MISCELLANEOUS) ×2 IMPLANT
CLOTH BEACON ORANGE TIMEOUT ST (SAFETY) ×2 IMPLANT
COVER LIGHT HANDLE STERIS (MISCELLANEOUS) ×4 IMPLANT
DECANTER SPIKE VIAL GLASS SM (MISCELLANEOUS) ×2 IMPLANT
DRAPE PROXIMA HALF (DRAPES) ×2 IMPLANT
DRAPE STERI URO 9X17 APER PCH (DRAPES) ×2 IMPLANT
ELECT REM PT RETURN 9FT ADLT (ELECTROSURGICAL) ×2
ELECTRODE REM PT RTRN 9FT ADLT (ELECTROSURGICAL) ×1 IMPLANT
FORMALIN 10 PREFIL 480ML (MISCELLANEOUS) IMPLANT
GAUZE PACKING 2X5 YD STERILE (GAUZE/BANDAGES/DRESSINGS) ×2 IMPLANT
GLOVE ECLIPSE 9.0 STRL (GLOVE) ×2 IMPLANT
GLOVE INDICATOR 7.0 STRL GRN (GLOVE) ×4 IMPLANT
GLOVE INDICATOR STER SZ 9 (GLOVE) ×2 IMPLANT
GLOVE SKINSENSE NS SZ6.5 (GLOVE) ×1
GLOVE SKINSENSE NS SZ7.0 (GLOVE) ×2
GLOVE SKINSENSE STRL SZ6.5 (GLOVE) ×1 IMPLANT
GLOVE SKINSENSE STRL SZ7.0 (GLOVE) ×2 IMPLANT
GLOVE SS PI 9.0 STRL (GLOVE) ×2 IMPLANT
GOWN STRL REIN 3XL LVL4 (GOWN DISPOSABLE) ×2 IMPLANT
GOWN STRL REIN XL XLG (GOWN DISPOSABLE) ×2 IMPLANT
KIT ROOM TURNOVER AP CYSTO (KITS) ×2 IMPLANT
MANIFOLD NEPTUNE II (INSTRUMENTS) ×2 IMPLANT
NEEDLE HYPO 25X1 1.5 SAFETY (NEEDLE) ×2 IMPLANT
NS IRRIG 1000ML POUR BTL (IV SOLUTION) ×2 IMPLANT
PACK PERI GYN (CUSTOM PROCEDURE TRAY) ×2 IMPLANT
PAD ARMBOARD 7.5X6 YLW CONV (MISCELLANEOUS) ×2 IMPLANT
SET BASIN LINEN APH (SET/KITS/TRAYS/PACK) ×2 IMPLANT
SUT CHROMIC 2 0 CT 1 (SUTURE) ×2 IMPLANT
SUT VIC AB 0 CT2 8-18 (SUTURE) ×2 IMPLANT
SYR CONTROL 10ML LL (SYRINGE) ×2 IMPLANT
TRAY FOLEY CATH 16FR SILVER (SET/KITS/TRAYS/PACK) IMPLANT

## 2013-08-08 NOTE — Anesthesia Procedure Notes (Signed)
Procedure Name: LMA Insertion Date/Time: 08/08/2013 9:45 AM Performed by: Despina Hidden Pre-anesthesia Checklist: Emergency Drugs available, Patient identified, Suction available and Patient being monitored Patient Re-evaluated:Patient Re-evaluated prior to inductionOxygen Delivery Method: Circle system utilized Preoxygenation: Pre-oxygenation with 100% oxygen Intubation Type: IV induction Ventilation: Mask ventilation without difficulty LMA: LMA inserted LMA Size: 3.0 Number of attempts: 1 Placement Confirmation: breath sounds checked- equal and bilateral and positive ETCO2 Tube secured with: Tape Dental Injury: Teeth and Oropharynx as per pre-operative assessment

## 2013-08-08 NOTE — Anesthesia Preprocedure Evaluation (Signed)
Anesthesia Evaluation  Patient identified by MRN, date of birth, ID band Patient awake    Reviewed: Allergy & Precautions, H&P , NPO status , Patient's Chart, lab work & pertinent test results  Airway Mallampati: II TM Distance: <3 FB     Dental  (+) Teeth Intact   Pulmonary former smoker,  breath sounds clear to auscultation        Cardiovascular + angina with exertion + CAD, + Past MI and + Cardiac Stents Rhythm:Regular Rate:Normal     Neuro/Psych    GI/Hepatic   Endo/Other    Renal/GU      Musculoskeletal   Abdominal   Peds  Hematology   Anesthesia Other Findings   Reproductive/Obstetrics                           Anesthesia Physical Anesthesia Plan  ASA: III  Anesthesia Plan: General   Post-op Pain Management:    Induction: Intravenous  Airway Management Planned: LMA  Additional Equipment:   Intra-op Plan:   Post-operative Plan: Extubation in OR  Informed Consent: I have reviewed the patients History and Physical, chart, labs and discussed the procedure including the risks, benefits and alternatives for the proposed anesthesia with the patient or authorized representative who has indicated his/her understanding and acceptance.     Plan Discussed with:   Anesthesia Plan Comments:         Anesthesia Quick Evaluation

## 2013-08-08 NOTE — Interval H&P Note (Signed)
History and Physical Interval Note:  08/08/2013 9:28 AM  Dawn Washington  has presented today for surgery, with the diagnosis of Rectocele  The various methods of treatment have been discussed with the patient and family. After consideration of risks, benefits and other options for treatment, the patient has consented to  Procedure(s): POSTERIOR REPAIR (RECTOCELE) (N/A) as a surgical intervention .  The patient's history has been reviewed, patient examined, no change in status, stable for surgery.  I have reviewed the patient's chart and labs.  Questions were answered to the patient's satisfaction.   Bowel prep effective   Cyan Moultrie V

## 2013-08-08 NOTE — Anesthesia Postprocedure Evaluation (Signed)
  Anesthesia Post-op Note  Patient: Dawn Washington  Procedure(s) Performed: Procedure(s): POSTERIOR REPAIR (RECTOCELE) (N/A)  Patient Location: PACU  Anesthesia Type:General  Level of Consciousness: awake, alert , oriented and patient cooperative  Airway and Oxygen Therapy: Patient Spontanous Breathing  Post-op Pain: 3 /10, mild  Post-op Assessment: Post-op Vital signs reviewed, Patient's Cardiovascular Status Stable, Respiratory Function Stable, Patent Airway, No signs of Nausea or vomiting and Pain level controlled  Post-op Vital Signs: Reviewed and stable  Complications: No apparent anesthesia complications

## 2013-08-08 NOTE — Brief Op Note (Signed)
08/08/2013  10:47 AM  PATIENT:  Dawn Washington  47 y.o. female  PRE-OPERATIVE DIAGNOSIS:  Rectocele  POST-OPERATIVE DIAGNOSIS:  Rectocele  PROCEDURE:  Procedure(s): POSTERIOR REPAIR (RECTOCELE) (N/A)  SURGEON:  Surgeon(s) and Role:    * Tilda Burrow, MD - Primary  PHYSICIAN ASSISTANT: Reola Calkins, MD  ASSISTANTS: none   ANESTHESIA:   local and general  EBL:  Total I/O In: 600 [I.V.:600] Out: -   BLOOD ADMINISTERED:none  DRAINS: none   LOCAL MEDICATIONS USED:  MARCAINE    and Amount: 20 ml  SPECIMEN:  No Specimen  DISPOSITION OF SPECIMEN:  N/A  COUNTS:  YES  TOURNIQUET:  * No tourniquets in log *  DICTATION: .Dragon Dictation  PLAN OF CARE: Discharge to home after PACU  PATIENT DISPOSITION:  PACU - hemodynamically stable.   Delay start of Pharmacological VTE agent (>24hrs) due to surgical blood loss or risk of bleeding: not applicable

## 2013-08-08 NOTE — H&P (View-Only) (Signed)
  Patient ID: Scotlynn E Abernathy, female DOB: 03/06/1966, 47 y.o. MRN: [id number]  [facility] Visit   Patient name: Leasha E Hennessee MRN [id number] Date of birth: 06/11/1966  CC & HPI:   Merina E Blacketer is a 47 y.o. female presenting today for discussion and scheduling a rectocele repair, for symptomatic rectocele that has been present since her delivery of her second child 20 some years ago. At this point her life it requires constant splinting of the perineum in order to defecate.. Patient denies anal incontinence.  We have discussed consideration of any additonal surgeries at this time , such as hysterectomy , and patient is stable without uterine concerns at this time.  She is aware that posterior repair, while beneficial for current condition, will make consideration of future vaginal surgeries such as vag hyst, more difficult. ROS:   Negative for stress incontinence negative for dyspareunia. Patient does note that she is having vasomotor symptoms and periods are becoming irregular without intermenstrual bleeding, simply less frequent  Pertinent History Reviewed:   Medical & Surgical Hx: Reviewed: Significant for delivery her second child was notable for an episiotomy that was followed by "tearing all the way through". At this time the sphincter is intact but a large rectocele exists just above the anal sphincter  Medical history positive for heart disease having required stents at age 43, followed by Dr. Cooper of [place] [facility]  Medications: Reviewed & Updated - see associated section  Social History: Reviewed - reports that she quit smoking about 11 years ago. She has never used smokeless tobacco.  Objective Findings:   Vitals: BP 120/88  Ht 5' 8" (1.727 m)  Wt 147 lb 12.8 oz (67.042 kg)  BMI 22.48 kg/m2  LMP 05/28/2013  Physical Examination: General appearance - alert, well appearing, and in no distress, oriented to person, place, and time and normal appearing weight  Mental status  - alert, oriented to person, place, and time  Eyes - pupils equal and reactive, extraocular eye movements intact  Abdomen - soft, nontender, nondistended, no masses or organomegaly  Well-healed umbilical incision status post laparoscopic cholecystectomy and tubal ligation  Pelvic - VULVA: normal appearing vulva with no masses, tenderness or lesions, snug introitus  , VAGINA: normal appearing vagina with normal color and discharge, no lesions, PELVIC FLOOR EXAM: rectocele above intact sphincter, very thin rectovaginal septum in mid portion of vagina  , CERVIX: normal appearing cervix without discharge or lesions, UTERUS: uterus is normal size, shape, consistency and nontender, ADNEXA: normal adnexa in size, nontender and no masses, RECTAL: rectocele noted , as above  Rectal - a large rectocele exists above the anal sphincter which is intact and with good tone  Assessment & Plan:   Symptomatic rectocele above intact anal sphincter, normal bladder and uterine support will not require anterior repair or hysterectomy  Patient desires to proceed toward rectocele repair, tentatively scheduled for 08 Aug 2013.  Consent from Cardiology obtained , see their note.      

## 2013-08-08 NOTE — Transfer of Care (Signed)
Immediate Anesthesia Transfer of Care Note  Patient: Dawn Washington  Procedure(s) Performed: Procedure(s): POSTERIOR REPAIR (RECTOCELE) (N/A)  Patient Location: PACU  Anesthesia Type:General  Level of Consciousness: awake and patient cooperative  Airway & Oxygen Therapy: Patient Spontanous Breathing and Patient connected to face mask oxygen  Post-op Assessment: Report given to PACU RN, Post -op Vital signs reviewed and stable and Patient moving all extremities  Post vital signs: Reviewed and stable  Complications: No apparent anesthesia complications

## 2013-08-08 NOTE — Op Note (Signed)
0/28/2014  10:47 AM  PATIENT:  Dawn Washington  47 y.o. female  PRE-OPERATIVE DIAGNOSIS:  Rectocele  POST-OPERATIVE DIAGNOSIS:  Rectocele  PROCEDURE:  Procedure(s): POSTERIOR REPAIR (RECTOCELE) (N/A)  SURGEON:  Surgeon(s) and Role:    * Tilda Burrow, MD - Primary  PHYSICIAN ASSISTANT: Reola Calkins, MD  ASSISTANTS: none   ANESTHESIA:   local and general  EBL:  Total I/O In: 600 [I.V.:600] Out: -   Details of procedure:. Patient was taken to the operating room prepped and draped for vaginal procedure with timeout conducted by surgical team and confirmed. Antibiotics were administered. The posterior peritoneum was opened in the midline the lines of the prior obstetric laceration used to open the perineal body after local anesthesia was used to infiltrate the perineum with 30 cc of Marcaine with epinephrine. Lateral dissection was performed identifying good pararectal tissue on either side. With a double gloved index finger in the rectum for proper anatomy identification, Allis clamps were used to grasp pararectal tissues on either side which could be pulled into the midline and sutured with a series of 0 Vicryl sutures resulting in reinforced perineal body. The second suture was used to attach to the lower tissues just above the anal sphincter which reduced potential for transverse defect recurrence perineal body was reinforced with 2 additional sutures of 0 Vicryl. Gloves were changed and the procedure completed by subcuticular 2-0 chromic closure of the vaginal epithelium with good perineal support achieved. Patient to recovery room in good condition. She has no pain at this time and will be allowed to go home for home care

## 2013-08-10 ENCOUNTER — Encounter (HOSPITAL_COMMUNITY): Payer: Self-pay | Admitting: Obstetrics and Gynecology

## 2013-08-16 ENCOUNTER — Encounter: Payer: Self-pay | Admitting: Obstetrics and Gynecology

## 2013-08-16 ENCOUNTER — Ambulatory Visit (INDEPENDENT_AMBULATORY_CARE_PROVIDER_SITE_OTHER): Payer: BC Managed Care – PPO | Admitting: Obstetrics and Gynecology

## 2013-08-16 VITALS — BP 108/60 | Wt 148.0 lb

## 2013-08-16 DIAGNOSIS — B3731 Acute candidiasis of vulva and vagina: Secondary | ICD-10-CM | POA: Insufficient documentation

## 2013-08-16 DIAGNOSIS — B373 Candidiasis of vulva and vagina: Secondary | ICD-10-CM

## 2013-08-16 DIAGNOSIS — Z9889 Other specified postprocedural states: Secondary | ICD-10-CM

## 2013-08-16 MED ORDER — METRONIDAZOLE 0.75 % VA GEL: 1.0000 | Freq: Every day | VAGINAL | Status: DC

## 2013-08-16 MED ORDER — NYSTATIN-TRIAMCINOLONE 100000-0.1 UNIT/GM-% EX OINT: 1.0000 "application " | TOPICAL_OINTMENT | Freq: Two times a day (BID) | CUTANEOUS | Status: DC

## 2013-08-16 MED ORDER — FLUCONAZOLE 150 MG PO TABS: 150.0000 mg | ORAL_TABLET | Freq: Once | ORAL | Status: DC

## 2013-08-16 NOTE — Patient Instructions (Signed)
AS DIRECTED

## 2013-08-16 NOTE — Progress Notes (Signed)
  Assessment:  Post-Op status post posterior colporrhaphy for LAXITY  RECTOCELE.1 weeks ago.  Doing well postoperatively.   Plan:  1. Continue any current medications. 2. Wound care discussed. 3. Activity restrictions: no lifting more than 20 pounds 4. Anticipated return to work: not applicable. 5. Follow up in 4 week.  Subjective:  Dawn Washington is a 47 y.o. female who presents to the clinic 1 weeks status post posterior colporrhaphy.  She has been eating a regular diet without difficulty.  Bowel movements are normal. Pain is controlled with current analgesics. Medications being used: narcotic analgesics including VICODIN. WAS CONSTIPATED ,RESOLVED YB ENEMA  Review of Systems Negative except as per HPI   Objective:  BP 108/60  Wt 148 lb (67.132 kg)  LMP 05/28/2013 General:Well developed, well nourished.  No acute distress. Abdomen: Bowel sounds normal, soft, non-tender. Pelvic Exam: External Genitalia:  YEAST! Vagina: Normal Bimanual: Normal Cervix: Normal Uterus: Normal Adnexa: Normal Incision(s):Healing well, no drainage, no erythema, no hernia, no swelling, no dehiscence, incision well approximated.

## 2013-08-17 ENCOUNTER — Other Ambulatory Visit: Payer: Self-pay

## 2013-09-13 ENCOUNTER — Encounter: Payer: Self-pay | Admitting: Obstetrics and Gynecology

## 2013-09-13 ENCOUNTER — Ambulatory Visit (INDEPENDENT_AMBULATORY_CARE_PROVIDER_SITE_OTHER): Payer: BC Managed Care – PPO | Admitting: Obstetrics and Gynecology

## 2013-09-13 VITALS — BP 122/70 | Ht 68.0 in | Wt 150.0 lb

## 2013-09-13 DIAGNOSIS — Z9889 Other specified postprocedural states: Secondary | ICD-10-CM

## 2013-09-13 NOTE — Progress Notes (Signed)
Patient ID: Dawn Washington, female   DOB: 02/10/1966, 47 y.o.   MRN: [id number]  Assessment:  Post-Op status post posterior colporrhaphy for pelvic relaxation and recotcele.5 weeks ago.  Doing well postoperatively.pt and partner ready for sexual activity. No discomforts , BM's now feel fine,   Plan:  1. Continue any current medications. 2. Wound care discussed. 3. Activity restrictions: none 4. Anticipated return to work: not applicable. 5. Follow up in 1 yr.  Subjective:  Dawn Washington is a 47 y.o. female who presents to the clinic 5 weeks status post posterior colporrhaphy.  She has been eating a regular diet without difficulty.  Bowel movements are normal. The patient is not having any pain.  Review of Systems Negative except as per HPI   Objective:  BP 122/70  Ht 5\' 8"  (1.727 m)  Wt 150 lb (68.04 kg)  BMI 22.81 kg/m2 General:Well developed, well nourished.  No acute distress. Abdomen: Bowel sounds normal, soft, non-tender. Pelvic Exam: External Genitalia:  Normal. Good support Vagina: Normal Bimanual: Normal Cervix: Normal Uterus: Normal Adnexa: Normal Incision(s):Healing well, no drainage, no erythema, no hernia, no swelling, no dehiscence, incision well approximated.

## 2013-09-13 NOTE — Patient Instructions (Signed)
Anterior and Posterior Colporrhaphy  Anterior or posterior colporrhaphy is surgery to fix a prolapse of organs in the genital tract. Prolapse means the falling down, bulging, dropping, or drooping of an organ. Organs that commonly prolapse include the rectum, bladder, vagina, and uterus. Prolapse can affect a single organ or several organs at the same time. This often worsens when women stop having their monthly periods (menopause) because estrogen loss weakens the muscles and tissues in the genital tract. In addition, prolapse happens when the organs are damaged or weakened. This commonly happens after childbirth and as a result of aging. Surgery is often done for severe prolapses.   The type of colporrhaphy done depends on the type of genital prolapse. Types of genital prolapse include the following:   · Cystocele. This is a prolapse of the upper (anterior) wall of the vagina. The anterior wall bulges into the vagina and brings the bladder with it.    · Rectocele. This is a prolapse of the lower (posterior) wall of the vagina. The posterior vaginal wall bulges into the vagina and brings the rectum with it.    · Enterocele. This is a prolapse of part of the pelvic organs called the pouch of Douglas. It also involves a portion of the small bowel. It appears as a bulge under the neck of the uterus at the top of the back wall of the vagina.    · Procidentia. This is a complete prolapse of the uterus and the cervix. The prolapse can be seen and felt coming out of the vagina.  LET YOUR HEALTH CARE PROVIDER KNOW ABOUT:   · Any allergies you have.    · All medicines you are taking, including vitamins, herbs, eye drops, creams, and over-the-counter medicines.    · Previous problems you or members of your family have had with the use of anesthetics.    · Any blood disorders you have.    · Previous surgeries you have had.    · Medical conditions you have.    · Smoking history or history of alcohol use.    · Possibility of  pregnancy, if this applies.    RISKS AND COMPLICATIONS  Generally, anterior or posterior colporrhaphy is a safe procedure. However, as with any procedure, complications can occur. Possible complications include:   · Infection.    · Damage to other organs during surgery.    · Bleeding after surgery.    · Problems urinating.    · Problems from the anesthetic.    BEFORE THE PROCEDURE  · Ask your health care provider about changing or stopping your regular medicines.    · Do not eat or drink anything for at least 8 hours before the surgery.    · If you smoke, do not smoke for at least 2 weeks before the surgery.    · Make plans to have someone drive you home after your hospital stay. Also, arrange for someone to help you with activities during recovery.  PROCEDURE   You may be given medicine to help you relax before the surgery (sedative). During the surgery you will be given medicine to make you sleep through the procedure (general anesthetic) or medicine to numb you from the waist down (spinal anesthetic). This medicine will be given through an intravenous (IV) access tube that is put into one of your veins.   The procedure will vary depending on the type of repair:   · Anterior repair. A cut (incision) is made in the midline section of the front part of the vaginal wall. A triangular-shaped piece of vaginal tissue is removed,   and the stronger, healthier tissue is sewn together in order to support and suspend the bladder.    · Posterior repair. An incision is made midline on the back wall of the vagina. A triangular portion of vaginal skin is removed to expose the muscle. Excess tissue is removed, and stronger, healthier muscle and ligament tissue is sewn together to support the rectum.    · Anterior and posterior repair. Both procedures are done during the same surgery.  AFTER THE PROCEDURE  You will be taken to a recovery area. Your blood pressure, pulse, breathing, and temperature (vital signs) will be monitored.  This is done until you are stable. Then you will be transferred to a hospital room.   After surgery, you will have a small rubber tube in place to drain your bladder (urinary catheter). This will be in place for 2 to 7 days or until your bladder is working properly on its own. The IV access tube will be removed in 1 to 3 days. You may have a gauze packing in your vagina to prevent bleeding. This will be removed 2 or 3 days after the surgery. You will likely need to stay in the hospital for 3 to 5 days.   Document Released: 12/19/2003 Document Revised: 05/31/2013 Document Reviewed: 02/17/2013  [employer]® Patient Information ©2014 [employer].

## 2014-05-08 ENCOUNTER — Encounter: Payer: Self-pay | Admitting: Cardiovascular Disease

## 2014-05-08 ENCOUNTER — Ambulatory Visit (INDEPENDENT_AMBULATORY_CARE_PROVIDER_SITE_OTHER): Payer: BC Managed Care – PPO | Admitting: Cardiovascular Disease

## 2014-05-08 VITALS — BP 138/80 | HR 66 | Ht 68.0 in | Wt 150.0 lb

## 2014-05-08 DIAGNOSIS — E782 Mixed hyperlipidemia: Secondary | ICD-10-CM

## 2014-05-08 DIAGNOSIS — I2510 Atherosclerotic heart disease of native coronary artery without angina pectoris: Secondary | ICD-10-CM

## 2014-05-08 DIAGNOSIS — I25119 Atherosclerotic heart disease of native coronary artery with unspecified angina pectoris: Secondary | ICD-10-CM

## 2014-05-08 DIAGNOSIS — R079 Chest pain, unspecified: Secondary | ICD-10-CM

## 2014-05-08 DIAGNOSIS — E7800 Pure hypercholesterolemia, unspecified: Secondary | ICD-10-CM

## 2014-05-08 DIAGNOSIS — I209 Angina pectoris, unspecified: Secondary | ICD-10-CM

## 2014-05-08 MED ORDER — ROSUVASTATIN CALCIUM 10 MG PO TABS: 10.0000 mg | ORAL_TABLET | Freq: Every day | ORAL | Status: DC

## 2014-05-08 NOTE — Patient Instructions (Signed)
Your physician has requested that you have a stress echocardiogram. For further information please visit [email or web address]. Please follow instruction sheet as given.  Your physician recommends that you return for a FASTING LIPID, LIVER and CK in October--nothing to eat or drink after midnight, lab opens at 7:30 am  Your physician recommends that you continue on your current medications as directed. Please refer to the Current Medication list given to you today.  Your physician wants you to follow-up in: 1 YEAR with Dr Excell Seltzerooper.  You will receive a reminder letter in the mail two months in advance. If you don't receive a letter, please call our office to schedule the follow-up appointment.

## 2014-05-08 NOTE — Progress Notes (Signed)
HPI:  48 year old woman presenting for followup evaluation. The patient has coronary artery disease and initially presented in 2011 with a non-ST elevation MI. She underwent PCI of a critical stenosis in her proximal LAD. She had recurrent unstable angina and had a new lesion in the distal right coronary artery in 2012 treated again with a drug-eluting stent. She had a followup cardiac catheterization after again developing chest pain and this demonstrated patency of both of her stent sites. Her last stress test was in April 2013 and it demonstrated normal left ventricular function with the gated ejection fraction of 73% and normal perfusion throughout.  The patient has been statin intolerant. Last lipids from October 2014 below: Lipid Panel     Component Value Date/Time   CHOL 187 08/03/2013 0946   TRIG 70.0 08/03/2013 0946   HDL 80.10 08/03/2013 0946   CHOLHDL 2 08/03/2013 0946   VLDL 14.0 08/03/2013 0946   LDLCALC 93 08/03/2013 0946   Continues to have frequent palpitations, primarily at night. Intermittent chest discomfort with exertion, resolves with rest. She states that Sunday she is able to walk without symptoms, and other days she is limited by chest pressure. There is no clear pattern to her symptoms. She has tried to avoid any medications. She has tolerated low-dose Crestor, but admits to myalgias in her upper arms at times. She denies dyspnea, edema, lightheadedness, or syncope.   Outpatient Encounter Prescriptions as of 05/08/2014  Medication Sig  . aspirin 81 MG tablet Take 81 mg by mouth daily.  Marland Kitchen. CALCIUM-MAGNESIUM-ZINC PO Take 1 tablet by mouth daily.  Marland Kitchen. KRILL OIL PO Take 1 tablet by mouth daily.  . Multiple Vitamin (MULTIVITAMIN) capsule Take 1 capsule by mouth daily.  . rosuvastatin (CRESTOR) 10 MG tablet Take 1 tablet (10 mg total) by mouth daily.  . [DISCONTINUED] BIOTIN 5000 PO Take 2 tablets by mouth daily.  . [DISCONTINUED] docusate sodium (COLACE) 100 MG capsule  Take 100 mg by mouth daily.  . [DISCONTINUED] fluconazole (DIFLUCAN) 150 MG tablet Take 1 tablet (150 mg total) by mouth once.  . [DISCONTINUED] metroNIDAZOLE (METROGEL) 0.75 % vaginal gel Place 1 Applicatorful vaginally at bedtime. Apply one applicatorful to vagina at bedtime for 5 days  . [DISCONTINUED] nystatin-triamcinolone ointment (MYCOLOG) Apply 1 application topically 2 (two) times daily. To affected area.  . [DISCONTINUED] oxyCODONE-acetaminophen (PERCOCET/ROXICET) 5-325 MG per tablet Take 1 tablet by mouth every 4 (four) hours as needed for pain.  . [DISCONTINUED] Probiotic Product (PROBIOTIC DAILY PO) Take by mouth daily.    Allergies  Allergen Reactions  . Adhesive [Tape] Other (See Comments)    Skin Blisters   . Latex Other (See Comments)    Skin Blisters   . Sulfonamide Derivatives Nausea Only  . Penicillins Nausea And Vomiting and Rash    Past Medical History  Diagnosis Date  . CAD (coronary artery disease)     NSTEMI in 2011 treated with DES to the LAD;    Cardiac cath 7/20: Mid RCA ectatic, proximal and mid RCA 30-40%, distal RCA 80-90%, left main 20-30%, proximal LAD stent patent with 30-40% after the stent, D1 ostial 50%, pCFX 50%;  Status post DES to the RCA 05/01/11  . Diverticulitis   . Dyslipidemia   . Gastroesophageal reflux disease   . Myocardial infarction     ROS: Negative except as per HPI  BP 138/80  Pulse 66  Ht 5\' 8"  (1.727 m)  Wt 150 lb (68.04 kg)  BMI 22.81 kg/m2  PHYSICAL EXAM: Pt is alert and oriented, NAD HEENT: normal Neck: JVP - normal, carotids 2+= without bruits Lungs: CTA bilaterally CV: RRR without murmur or gallop Abd: soft, NT, Positive BS, no hepatomegaly Ext: no C/C/E, distal pulses intact and equal Skin: warm/dry no rash  EKG:  NSR with frequent PVC's  ASSESSMENT AND PLAN: 1. Coronary artery disease, native vessel. The patient is having exertional angina on an intermittent basis. I recommended an exercise stress  echocardiogram for further ischemic evaluation. She continues on aspirin and a statin drug. We discussed the addition of a low-dose beta blocker, but she declined. I will plan on seeing her back in one year unless problems arise.  2. Heart palpitations. Frequent PVCs on EKG noted. Again, discussed addition of metoprolol, but patient declined. Advised her to avoid caffeine and reviewed the importance of good sleep hygiene and exercise.  3. Hyperlipidemia. Statin dose limited by myalgias. Will repeat lipids, LFTs, and CK in October at one year from her previous labs.  [name] 05/08/2014 9:52 AM

## 2014-05-18 ENCOUNTER — Ambulatory Visit (HOSPITAL_BASED_OUTPATIENT_CLINIC_OR_DEPARTMENT_OTHER): Payer: BC Managed Care – PPO

## 2014-05-18 ENCOUNTER — Ambulatory Visit (HOSPITAL_COMMUNITY): Payer: BC Managed Care – PPO | Attending: Cardiovascular Disease

## 2014-05-18 DIAGNOSIS — R079 Chest pain, unspecified: Secondary | ICD-10-CM

## 2014-05-18 DIAGNOSIS — I2510 Atherosclerotic heart disease of native coronary artery without angina pectoris: Secondary | ICD-10-CM | POA: Diagnosis not present

## 2014-05-18 DIAGNOSIS — E7800 Pure hypercholesterolemia, unspecified: Secondary | ICD-10-CM

## 2014-05-18 DIAGNOSIS — E782 Mixed hyperlipidemia: Secondary | ICD-10-CM

## 2014-05-18 DIAGNOSIS — R0989 Other specified symptoms and signs involving the circulatory and respiratory systems: Secondary | ICD-10-CM

## 2014-05-18 DIAGNOSIS — I25119 Atherosclerotic heart disease of native coronary artery with unspecified angina pectoris: Secondary | ICD-10-CM

## 2014-05-18 NOTE — Progress Notes (Signed)
Stress echo completed

## 2014-07-09 ENCOUNTER — Encounter: Payer: Self-pay | Admitting: Cardiovascular Disease

## 2014-07-11 ENCOUNTER — Other Ambulatory Visit: Payer: Self-pay

## 2014-07-11 DIAGNOSIS — Z1231 Encounter for screening mammogram for malignant neoplasm of breast: Secondary | ICD-10-CM

## 2014-07-16 ENCOUNTER — Ambulatory Visit
Admission: RE | Admit: 2014-07-16 | Discharge: 2014-07-16 | Disposition: A | Discharge status: Home / Self Care | Payer: BC Managed Care – PPO | Source: Ambulatory Visit

## 2014-07-16 DIAGNOSIS — Z1231 Encounter for screening mammogram for malignant neoplasm of breast: Secondary | ICD-10-CM

## 2014-07-23 ENCOUNTER — Other Ambulatory Visit (INDEPENDENT_AMBULATORY_CARE_PROVIDER_SITE_OTHER): Payer: BC Managed Care – PPO | Admitting: *Deleted

## 2014-07-23 DIAGNOSIS — E7800 Pure hypercholesterolemia, unspecified: Secondary | ICD-10-CM

## 2014-07-23 DIAGNOSIS — E782 Mixed hyperlipidemia: Secondary | ICD-10-CM

## 2014-07-23 DIAGNOSIS — R079 Chest pain, unspecified: Secondary | ICD-10-CM

## 2014-07-23 DIAGNOSIS — I25119 Atherosclerotic heart disease of native coronary artery with unspecified angina pectoris: Secondary | ICD-10-CM

## 2014-07-23 LAB — HEPATIC FUNCTION PANEL
ALBUMIN: 3.8 g/dL (ref 3.5–5.2)
ALT: 18 U/L (ref 0–35)
AST: 26 U/L (ref 0–37)
Alkaline Phosphatase: 47 U/L (ref 39–117)
Bilirubin, Direct: 0 mg/dL (ref 0.0–0.3)
Total Bilirubin: 0.3 mg/dL (ref 0.2–1.2)
Total Protein: 7.9 g/dL (ref 6.0–8.3)

## 2014-07-23 LAB — LIPID PANEL
CHOLESTEROL: 175 mg/dL (ref 0–200)
HDL: 67.2 mg/dL (ref >39.00)
LDL Cholesterol: 88 mg/dL (ref 0–99)
NonHDL: 107.8
TRIGLYCERIDES: 98 mg/dL (ref 0.0–149.0)
Total CHOL/HDL Ratio: 3
VLDL: 19.6 mg/dL (ref 0.0–40.0)

## 2014-07-23 LAB — CK: Total CK: 62 U/L (ref 7–177)

## 2015-04-17 ENCOUNTER — Ambulatory Visit (INDEPENDENT_AMBULATORY_CARE_PROVIDER_SITE_OTHER): Payer: BLUE CROSS/BLUE SHIELD | Admitting: Cardiovascular Disease

## 2015-04-17 ENCOUNTER — Encounter: Payer: Self-pay | Admitting: Cardiovascular Disease

## 2015-04-17 VITALS — BP 122/70 | HR 61 | Ht 68.0 in | Wt 159.4 lb

## 2015-04-17 DIAGNOSIS — R10819 Abdominal tenderness, unspecified site: Secondary | ICD-10-CM

## 2015-04-17 DIAGNOSIS — R1900 Intra-abdominal and pelvic swelling, mass and lump, unspecified site: Secondary | ICD-10-CM | POA: Diagnosis not present

## 2015-04-17 DIAGNOSIS — I2510 Atherosclerotic heart disease of native coronary artery without angina pectoris: Secondary | ICD-10-CM

## 2015-04-17 DIAGNOSIS — E7800 Pure hypercholesterolemia, unspecified: Secondary | ICD-10-CM

## 2015-04-17 MED ORDER — NITROGLYCERIN 0.4 MG SL SUBL: 0.4000 mg | SUBLINGUAL_TABLET | SUBLINGUAL | Status: DC | PRN

## 2015-04-17 MED ORDER — ROSUVASTATIN CALCIUM 10 MG PO TABS: 10.0000 mg | ORAL_TABLET | Freq: Every day | ORAL | Status: DC

## 2015-04-17 NOTE — Progress Notes (Signed)
Cardiology Office Note   Date:  04/17/2015   ID:  Dawn MckusickJanet Dixson, DOB 09/20/1966, MRN [id number]  PCP:  Egbert GaribaldiMillsaps, KIMBERLY M, NP  Cardiologist:  Tonny Bollmanooper, Abdulaziz Toman, MD    No chief complaint on file.   History of Present Illness: Dawn Washington is a 49 y.o. female who presents for follow-up evaluation.   The patient has coronary artery disease and initially presented in 2011 with a non-ST elevation MI. She underwent PCI of a critical stenosis in her proximal LAD. She had recurrent unstable angina and had a new lesion in the distal right coronary artery in 2012 treated again with a drug-eluting stent. She had a followup cardiac catheterization after again developing chest pain and this demonstrated patency of both of her stent sites. Her last stress test was in April 2013 and it demonstrated normal left ventricular function with the gated ejection fraction of 73% and normal perfusion throughout. A stress echo was done last year and this was normal.   The patient is doing well. She has started working at [employer] and enjoys her job. She has not been limited by chest discomfort or shortness of breath. She does have occasional chest pains, but nothing associated with physical exertion. She describes a discomfort to the right of the sternum, worse with palpation. She has not required any nitroglycerin since I saw her last year. She also complains of occasional palpitations but no change over several years. She notices pulsatility in her abdomen and complains of some discomfort in this area.  Past Medical History  Diagnosis Date  . CAD (coronary artery disease)     NSTEMI in 2011 treated with DES to the LAD;    Cardiac cath 7/20: Mid RCA ectatic, proximal and mid RCA 30-40%, distal RCA 80-90%, left main 20-30%, proximal LAD stent patent with 30-40% after the stent, D1 ostial 50%, pCFX 50%;  Status post DES to the RCA 05/01/11  . Diverticulitis   . Dyslipidemia   . Gastroesophageal reflux  disease   . Myocardial infarction     Past Surgical History  Procedure Laterality Date  . Stents  2011;2012  . Cardiac catheterization  05/28/2011;2011;2012  . Cholecystectomy  1999  . Tubal ligation  1999  . Wisdom teeth extractiion Bilateral 1986  . Rectocele repair N/A 08/08/2013    Procedure: POSTERIOR REPAIR (RECTOCELE);  Surgeon: Tilda BurrowJohn V Ferguson, MD;  Location: [facility];  Service: Gynecology;  Laterality: N/A;  . Mole removal Bilateral     2 moles removed from neck and 1 removed from her arm     Current Outpatient Prescriptions  Medication Sig Dispense Refill  . aspirin 81 MG tablet Take 81 mg by mouth daily.    Alphonsus Sias. Bee Pollen 550 MG CAPS Take 1 capsule by mouth daily.    Marland Kitchen. CALCIUM-MAGNESIUM-ZINC PO Take 1 tablet by mouth daily.    Marland Kitchen. KRILL OIL PO Take 1 tablet by mouth daily.    . Multiple Vitamin (MULTIVITAMIN) capsule Take 1 capsule by mouth daily.    . Probiotic Product (PROBIOTIC ADVANCED PO) Take 1 capsule by mouth daily.    . rosuvastatin (CRESTOR) 10 MG tablet Take 1 tablet (10 mg total) by mouth daily. 90 tablet 3  . TURMERIC CURCUMIN PO Take 1 capsule by mouth daily.     No current facility-administered medications for this visit.    Allergies:   Adhesive; Latex; Sulfonamide derivatives; and Penicillins   Social History:  The patient  reports that she quit  smoking about 13 years ago. Her smoking use included Cigarettes. She has a 30 pack-year smoking history. She has never used smokeless tobacco. She reports that she does not drink alcohol or use illicit drugs.   Family History:  The patient's  family history includes Coronary artery disease in her father; Diabetes in her mother and sister; Heart disease in her father; Stroke in her maternal grandmother.    ROS:  Please see the history of present illness.  Otherwise, review of systems is positive for palpitations.  All other systems are reviewed and negative.    PHYSICAL EXAM: VS:  BP 122/70 mmHg  Pulse 61  Ht 5'  8" (1.727 m)  Wt 159 lb 6.4 oz (72.303 kg)  BMI 24.24 kg/m2 , BMI Body mass index is 24.24 kg/(m^2). GEN: Well nourished, well developed, in no acute distress HEENT: normal Neck: no JVD, no masses. No carotid bruits Cardiac: RRR without murmur or gallop                Respiratory:  clear to auscultation bilaterally, normal work of breathing GI: soft, nondistended, + BS, mild tenderness in the midabdomen with palpation of the aorta, no obvious aortic enlargement by palpation MS: no deformity or atrophy Ext: no pretibial edema, pedal pulses 2+= bilaterally Skin: warm and dry, no rash Neuro:  Strength and sensation are intact Psych: euthymic mood, full affect  EKG:  EKG is ordered today. The ekg ordered today shows normal sinus rhythm 61 bpm, within normal limits.  Recent Labs: 07/23/2014: ALT 18   Lipid Panel     Component Value Date/Time   CHOL 175 07/23/2014 0948   TRIG 98.0 07/23/2014 0948   HDL 67.20 07/23/2014 0948   CHOLHDL 3 07/23/2014 0948   VLDL 19.6 07/23/2014 0948   LDLCALC 88 07/23/2014 0948   LDLDIRECT 159.4 05/02/2013 1136      Wt Readings from Last 3 Encounters:  04/17/15 159 lb 6.4 oz (72.303 kg)  05/08/14 150 lb (68.04 kg)  09/13/13 150 lb (68.04 kg)     Cardiac Studies Reviewed: Stress Echo 05/18/2014: Study Conclusions  - Stress ECG conclusions: There were no stress arrhythmias or conduction abnormalities. The stress ECG was negative for ischemia. - Staged echo: There was no echocardiographic evidence for stress-induced ischemia. - Impressions: Normal resting and stress images PVC;s with exertion  Impressions:  - Normal resting and stress images PVC;s with exertion  ASSESSMENT AND PLAN: 1.  CAD, native vessel, without symptoms of angina: The patient appears stable. Her stress echocardiogram last year was within normal limits. She will continue on her current medical program which includes aspirin and Crestor.  2. Hyperlipidemia: Most  recent lipids reviewed. Crestor dose is limited by myalgias. She is tolerating her current dose well. She will be due for lipids and LFTs in October of this year.  3. Abdominal discomfort, prominent aortic pulse a patient: Will check abdominal duplex scan to rule out AAA.  Current medicines are reviewed with the patient today.  The patient does not have concerns regarding medicines.  Labs/ tests ordered today include:  No orders of the defined types were placed in this encounter.   Disposition:   FU one year  Signed, [name], MD  04/17/2015 10:10 AM    [facility] [address] [place], [place], [place]  [address] Phone: [phone]; Fax: [phone]

## 2015-04-17 NOTE — Patient Instructions (Signed)
Medication Instructions:  Your physician recommends that you continue on your current medications as directed. Please refer to the Current Medication list given to you today.  Labwork: Your physician recommends that you return for a FASTING LIPID and LIVER PANEL in October--nothing to eat or drink after midnight, lab opens at 7:30 AM  Testing/Procedures: Your physician has requested that you have an abdominal aorta duplex. During this test, an ultrasound is used to evaluate the aorta. Allow 30 minutes for this exam. Do not eat after midnight the day before and avoid carbonated beverages  Follow-Up: Your physician wants you to follow-up in: 1 YEAR with Dr Excell Seltzerooper.  You will receive a reminder letter in the mail two months in advance. If you don't receive a letter, please call our office to schedule the follow-up appointment.   Any Other Special Instructions Will Be Listed Below (If Applicable).

## 2015-04-18 ENCOUNTER — Encounter: Payer: Self-pay | Admitting: Cardiovascular Disease

## 2015-04-26 ENCOUNTER — Ambulatory Visit (HOSPITAL_COMMUNITY): Payer: BLUE CROSS/BLUE SHIELD | Attending: Cardiology

## 2015-04-26 ENCOUNTER — Other Ambulatory Visit (HOSPITAL_COMMUNITY): Payer: BLUE CROSS/BLUE SHIELD

## 2015-04-26 DIAGNOSIS — R1900 Intra-abdominal and pelvic swelling, mass and lump, unspecified site: Secondary | ICD-10-CM

## 2015-04-26 DIAGNOSIS — R10819 Abdominal tenderness, unspecified site: Secondary | ICD-10-CM | POA: Diagnosis not present

## 2015-05-28 ENCOUNTER — Other Ambulatory Visit: Payer: Self-pay | Admitting: Cardiovascular Disease

## 2015-07-10 ENCOUNTER — Encounter: Payer: Self-pay | Admitting: Obstetrics and Gynecology

## 2015-07-10 ENCOUNTER — Other Ambulatory Visit (HOSPITAL_COMMUNITY)
Admission: RE | Admit: 2015-07-10 | Discharge: 2015-07-10 | Disposition: A | Discharge status: Home / Self Care | Payer: BLUE CROSS/BLUE SHIELD | Source: Ambulatory Visit | Attending: Obstetrics and Gynecology | Admitting: Obstetrics and Gynecology

## 2015-07-10 ENCOUNTER — Ambulatory Visit (INDEPENDENT_AMBULATORY_CARE_PROVIDER_SITE_OTHER): Payer: BLUE CROSS/BLUE SHIELD | Admitting: Obstetrics and Gynecology

## 2015-07-10 VITALS — BP 150/90 | Ht 68.0 in | Wt 151.0 lb

## 2015-07-10 DIAGNOSIS — Z1151 Encounter for screening for human papillomavirus (HPV): Secondary | ICD-10-CM | POA: Insufficient documentation

## 2015-07-10 DIAGNOSIS — Z01419 Encounter for gynecological examination (general) (routine) without abnormal findings: Secondary | ICD-10-CM | POA: Diagnosis present

## 2015-07-10 NOTE — Progress Notes (Signed)
Patient ID: Dawn Washington, female   DOB: 1966/06/25, 49 y.o.   MRN: [id number]  By signing my name below, I, Jarvis Morgan, attest that this documentation has been prepared under the direction and in the presence of Tilda Burrow, MD. Electronically Signed: Jarvis Morgan, ED Scribe. 07/10/2015. 10:00 AM.  Assessment:  Annual Gyn Exam   Plan:  1. pap smear done, next pap due 3 years 2. return annually or prn 3    Annual mammogram advised Subjective:  Dawn Washington is a 49 y.o. female No obstetric history on file. who presents for annual exam. Patient's last menstrual period was 05/15/2015. The patient has complaints today of mild hot flashes but states she is unable to take anything due to h/o MI. Pt has a cardiologist that she follows up with.   The following portions of the patient's history were reviewed and updated as appropriate: allergies, current medications, past family history, past medical history, past social history, past surgical history and problem list. Past Medical History  Diagnosis Date  . CAD (coronary artery disease)     NSTEMI in 2011 treated with DES to the LAD;    Cardiac cath 7/20: Mid RCA ectatic, proximal and mid RCA 30-40%, distal RCA 80-90%, left main 20-30%, proximal LAD stent patent with 30-40% after the stent, D1 ostial 50%, pCFX 50%;  Status post DES to the RCA 05/01/11  . Diverticulitis   . Dyslipidemia   . Gastroesophageal reflux disease   . Myocardial infarction     Past Surgical History  Procedure Laterality Date  . Stents  2011;2012  . Cardiac catheterization  05/28/2011;2011;2012  . Cholecystectomy  1999  . Tubal ligation  1999  . Wisdom teeth extractiion Bilateral 1986  . Rectocele repair N/A 08/08/2013    Procedure: POSTERIOR REPAIR (RECTOCELE);  Surgeon: Tilda Burrow, MD;  Location: [facility];  Service: Gynecology;  Laterality: N/A;  . Mole removal Bilateral     2 moles removed from neck and 1 removed from her arm      Current outpatient  prescriptions:  .  aspirin 81 MG tablet, Take 81 mg by mouth daily., Disp: , Rfl:  .  Bee Pollen 550 MG CAPS, Take 1 capsule by mouth daily., Disp: , Rfl:  .  CALCIUM-MAGNESIUM-ZINC PO, Take 1 tablet by mouth daily., Disp: , Rfl:  .  KRILL OIL PO, Take 1 tablet by mouth daily., Disp: , Rfl:  .  Multiple Vitamin (MULTIVITAMIN) capsule, Take 1 capsule by mouth daily., Disp: , Rfl:  .  Probiotic Product (PROBIOTIC ADVANCED PO), Take 1 capsule by mouth daily., Disp: , Rfl:  .  rosuvastatin (CRESTOR) 10 MG tablet, Take 1 tablet (10 mg total) by mouth daily., Disp: 30 tablet, Rfl: 11 .  TURMERIC CURCUMIN PO, Take 1 capsule by mouth daily., Disp: , Rfl:  .  nitroGLYCERIN (NITROSTAT) 0.4 MG SL tablet, Place 1 tablet (0.4 mg total) under the tongue every 5 (five) minutes as needed for chest pain. (Patient not taking: Reported on 07/10/2015), Disp: 25 tablet, Rfl: 2  Review of Systems Constitutional: positive for sweats Gastrointestinal: negative Genitourinary: negative  Objective:  BP 150/90 mmHg  Ht  (1.727 m)  Wt 151 lb (68.493 kg)  BMI 22.96 kg/m2  LMP 05/15/2015   BMI: Body mass index is 22.96 kg/(m^2).   General Appearance: Alert, appropriate appearance for age. No acute distress Gastrointestinal: Soft, non-tender, no masses or organomegaly Pelvic Exam: Vulva and vagina appear normal. Good vaginal support. Atrophic vaginal  tissues w/o inflammation. Bimanual exam reveals normal uterus and adnexa. Bladder support is excellent.  Rectovaginal: normal rectal, no masses and guaiac negative stool obtained Psychiatric: Alert and oriented, appropriate affect.  Urinalysis:Not done  Dawn Washington. MD Pgr [phone] 10:00 AM  I personally performed the services described in this documentation, which was SCRIBED in my presence. The recorded information has been reviewed and considered accurate. It has been edited as necessary during review. Tilda Burrow, MD

## 2015-07-10 NOTE — Progress Notes (Signed)
Patient ID: Dawn Washington, female   DOB: 1966/09/14, 49 y.o.   MRN: [id number] Pt here today for annual exam. Pt denies any problems or concerns at this time.

## 2015-07-12 ENCOUNTER — Other Ambulatory Visit: Payer: Self-pay

## 2015-07-12 DIAGNOSIS — Z1231 Encounter for screening mammogram for malignant neoplasm of breast: Secondary | ICD-10-CM

## 2015-07-12 LAB — CYTOLOGY - PAP

## 2015-07-22 ENCOUNTER — Other Ambulatory Visit (INDEPENDENT_AMBULATORY_CARE_PROVIDER_SITE_OTHER): Payer: BLUE CROSS/BLUE SHIELD | Admitting: *Deleted

## 2015-07-22 ENCOUNTER — Ambulatory Visit
Admission: RE | Admit: 2015-07-22 | Discharge: 2015-07-22 | Disposition: A | Discharge status: Home / Self Care | Payer: BLUE CROSS/BLUE SHIELD | Source: Ambulatory Visit

## 2015-07-22 DIAGNOSIS — E7800 Pure hypercholesterolemia, unspecified: Secondary | ICD-10-CM

## 2015-07-22 DIAGNOSIS — Z1231 Encounter for screening mammogram for malignant neoplasm of breast: Secondary | ICD-10-CM

## 2015-07-22 LAB — HEPATIC FUNCTION PANEL
ALT: 25 U/L (ref 6–29)
AST: 30 U/L (ref 10–35)
Albumin: 4.4 g/dL (ref 3.6–5.1)
Alkaline Phosphatase: 46 U/L (ref 33–115)
BILIRUBIN DIRECT: 0.1 mg/dL (ref <=0.2)
BILIRUBIN INDIRECT: 0.4 mg/dL (ref 0.2–1.2)
BILIRUBIN TOTAL: 0.5 mg/dL (ref 0.2–1.2)
Total Protein: 7.3 g/dL (ref 6.1–8.1)

## 2015-07-22 LAB — LIPID PANEL
Cholesterol: 167 mg/dL (ref 125–200)
HDL: 74 mg/dL (ref >=46)
LDL CALC: 79 mg/dL (ref <130)
Total CHOL/HDL Ratio: 2.3 Ratio (ref <=5.0)
Triglycerides: 71 mg/dL (ref <150)
VLDL: 14 mg/dL (ref <30)

## 2015-07-22 NOTE — Addendum Note (Signed)
Addended by: Tonita Phoenix on: 07/22/2015 11:16 AM   Modules accepted: Orders

## 2015-11-28 ENCOUNTER — Encounter: Payer: Self-pay | Admitting: Cardiovascular Disease

## 2015-12-02 ENCOUNTER — Encounter: Payer: Self-pay | Admitting: Neurology

## 2015-12-02 ENCOUNTER — Ambulatory Visit (INDEPENDENT_AMBULATORY_CARE_PROVIDER_SITE_OTHER): Payer: BLUE CROSS/BLUE SHIELD | Admitting: Neurology

## 2015-12-02 VITALS — BP 140/89 | HR 71 | Ht 68.0 in

## 2015-12-02 DIAGNOSIS — R202 Paresthesia of skin: Secondary | ICD-10-CM | POA: Diagnosis not present

## 2015-12-02 DIAGNOSIS — M542 Cervicalgia: Secondary | ICD-10-CM | POA: Insufficient documentation

## 2015-12-02 MED ORDER — ALPRAZOLAM 1 MG PO TABS: 1.0000 mg | ORAL_TABLET | Freq: Every evening | ORAL | Status: DC | PRN

## 2015-12-02 NOTE — Progress Notes (Signed)
PATIENT: Dawn Washington DOB: 1966-07-01  Chief Complaint  Patient presents with  . Hand Numbness    She has been experiencing right-hand numbess for 4-5 months.       HISTORICAL  Dawn Washington is a 50 years old right-handed female, seen in refer by her primary care Marva Panda in December 02 2015 for evaluation of right hand numbness  I reviewed and summarized referring now with she had a history of hyperlipidemia, hypertension, stroke heart attack at age 47, stent placement  Laboratory in February 2017, normal CMP, with creatinine 0.66, normal CBC, hemoglobin 14 point 4  She currently works as a Clinical biochemist, since October 2016, she began to notice numbness tingling initially only involving right fourth and fifth fingers, later she noticed paresthesia involving all 5 fingers at her right hand, she denies significant weakness, she has midline neck pain, but denies radiating pain from neck she has no gait difficulty, no bowel and bladder incontinence. She denies left upper extremity or bilateral lower extremity paresthesia or weakness.  REVIEW OF SYSTEMS: Full 14 system review of systems performed and notable only for as above  ALLERGIES: Allergies  Allergen Reactions  . Adhesive [Tape] Other (See Comments)    Skin Blisters   . Latex Other (See Comments)    Skin Blisters   . Sulfonamide Derivatives Nausea Only  . Penicillins Nausea And Vomiting and Rash    HOME MEDICATIONS: Current Outpatient Prescriptions  Medication Sig Dispense Refill  . aspirin 81 MG tablet Take 81 mg by mouth daily.    Alphonsus Sias Pollen 550 MG CAPS Take 1 capsule by mouth daily.    Marland Kitchen CALCIUM-MAGNESIUM-ZINC PO Take 1 tablet by mouth daily.    Marland Kitchen KRILL OIL PO Take 1 tablet by mouth daily.    . Multiple Vitamin (MULTIVITAMIN) capsule Take 1 capsule by mouth daily.    . nitroGLYCERIN (NITROSTAT) 0.4 MG SL tablet Place 1 tablet (0.4 mg total) under the tongue every 5 (five) minutes as needed for chest pain. 25 tablet 2   . Probiotic Product (PROBIOTIC ADVANCED PO) Take 1 capsule by mouth daily.    . rosuvastatin (CRESTOR) 10 MG tablet Take 1 tablet (10 mg total) by mouth daily. 30 tablet 11  . TURMERIC CURCUMIN PO Take 1 capsule by mouth daily.     No current facility-administered medications for this visit.    PAST MEDICAL HISTORY: Past Medical History  Diagnosis Date  . CAD (coronary artery disease)     NSTEMI in 2011 treated with DES to the LAD;    Cardiac cath 7/20: Mid RCA ectatic, proximal and mid RCA 30-40%, distal RCA 80-90%, left main 20-30%, proximal LAD stent patent with 30-40% after the stent, D1 ostial 50%, pCFX 50%;  Status post DES to the RCA 05/01/11  . Diverticulitis   . Dyslipidemia   . Gastroesophageal reflux disease   . Myocardial infarction (HCC)   . Numbness and tingling in right hand     PAST SURGICAL HISTORY: Past Surgical History  Procedure Laterality Date  . Stents  2011;2012  . Cardiac catheterization  05/28/2011;2011;2012  . Cholecystectomy  1999  . Tubal ligation  1999  . Wisdom teeth extractiion Bilateral 1986  . Rectocele repair N/A 08/08/2013    Procedure: POSTERIOR REPAIR (RECTOCELE);  Surgeon: Tilda Burrow, MD;  Location: [facility];  Service: Gynecology;  Laterality: N/A;  . Mole removal Bilateral     2 moles removed from neck and 1 removed from her arm  FAMILY HISTORY: Family History  Problem Relation Age of Onset  . Coronary artery disease Father     Strong family history of premature CAD with her father having bypass before the age of 68  . Heart disease Father   . Diabetes Mother   . Stroke Maternal Grandmother   . Diabetes Sister     SOCIAL HISTORY:  Social History   Social History  . Marital Status: Married    Spouse Name: N/A  . Number of Children: 2  . Years of Education: 14   Occupational History  . Homemaker    Social History Main Topics  . Smoking status: Former Smoker -- 1.25 packs/day for 24 years    Types: Cigarettes     Quit date: 10/12/2001  . Smokeless tobacco: Never Used  . Alcohol Use: No  . Drug Use: No  . Sexual Activity: Not Currently    Birth Control/ Protection: Surgical   Other Topics Concern  . 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   3 cups caffeine daily.     PHYSICAL EXAM   Filed Vitals:   12/02/15 0807  BP: 140/89  Pulse: 71  Height:  (1.727 m)    Not recorded      There is no weight on file to calculate BMI.  PHYSICAL EXAMNIATION:  Gen: NAD, conversant, well nourised, obese, well groomed                     Cardiovascular: Regular rate rhythm, no peripheral edema, warm, nontender. Eyes: Conjunctivae clear without exudates or hemorrhage Neck: Supple, no carotid bruise. Pulmonary: Clear to auscultation bilaterally   NEUROLOGICAL EXAM:  MENTAL STATUS: Speech:    Speech is normal; fluent and spontaneous with normal comprehension.  Cognition:     Orientation to time, place and person     Normal recent and remote memory     Normal Attention span and concentration     Normal Language, naming, repeating,spontaneous speech     Fund of knowledge   CRANIAL NERVES: CN II: Visual fields are full to confrontation. Fundoscopic exam is normal with sharp discs and no vascular changes. Pupils are round equal and briskly reactive to light. CN III, IV, VI: extraocular movement are normal. No ptosis. CN V: Facial sensation is intact to pinprick in all 3 divisions bilaterally. Corneal responses are intact.  CN VII: Face is symmetric with normal eye closure and smile. CN VIII: Hearing is normal to rubbing fingers CN IX, X: Palate elevates symmetrically. Phonation is normal. CN XI: Head turning and shoulder shrug are intact CN XII: Tongue is midline with normal movements and no atrophy.  MOTOR: There is no pronator drift of out-stretched arms. Muscle bulk and tone are normal. Muscle  strength is normal.  REFLEXES: Reflexes are 2+ and symmetric at the biceps, triceps, knees, and ankles. Plantar responses are flexor.  SENSORY: Intact to light touch, pinprick, position sense, and vibration sense are intact in fingers and toes, with exception of decreased light touch pinprick at right fingerpads. Involving all 5 fingers.  COORDINATION: Rapid alternating movements and fine finger movements are intact. There is no dysmetria on finger-to-nose and heel-knee-shin.    GAIT/STANCE: Posture is normal. Gait is steady with normal steps, base, arm swing, and turning. Heel and toe walking are normal. Tandem gait is normal.  Romberg is absent.   DIAGNOSTIC  DATA (LABS, IMAGING, TESTING) - I reviewed patient records, labs, notes, testing and imaging myself where available.   ASSESSMENT AND PLAN  Alynna Hargrove is a 50 y.o. female   Right hand numbness  Involving all 5 fingers,  Differentiation diagnosis includes right upper extremity neuropathy right median neuropathy and right ulnar neuropathy versus right cervical radiculopathy  Proceed with MRI of cervical spine  EMG nerve conduction study,     Levert Feinstein, M.D. Ph.D.  [facility] [address] [place], [place] [address] Ph: [phone] Fax: [phone]  CC: Marva Panda, NP

## 2015-12-04 DIAGNOSIS — R202 Paresthesia of skin: Secondary | ICD-10-CM | POA: Diagnosis not present

## 2015-12-05 ENCOUNTER — Ambulatory Visit (INDEPENDENT_AMBULATORY_CARE_PROVIDER_SITE_OTHER): Payer: Self-pay

## 2015-12-05 DIAGNOSIS — M542 Cervicalgia: Secondary | ICD-10-CM

## 2015-12-05 DIAGNOSIS — Z0289 Encounter for other administrative examinations: Secondary | ICD-10-CM

## 2015-12-05 DIAGNOSIS — R202 Paresthesia of skin: Secondary | ICD-10-CM

## 2015-12-06 ENCOUNTER — Telehealth: Payer: Self-pay | Admitting: Neurology

## 2015-12-06 NOTE — Telephone Encounter (Signed)
Reviewed, will go over her MRI at her EMG nerve conduction follow-up March first 2017  IMPRESSION:  Abnormal MRI cervical spine (without) demonstrating: 1. At C5-6: disc bulging and facet hypertrophy with borderline/mild spinal stenosis and severe biforaminal stenosis 2. At C6-7: disc bulging and uncovertebral joint hypertrophy with borderline/mild spinal stenosis and severe biforaminal stenosis  3. At C3-4: disc bulging and facet hypertrophy with mild right foraminal stenosis

## 2015-12-11 ENCOUNTER — Ambulatory Visit (INDEPENDENT_AMBULATORY_CARE_PROVIDER_SITE_OTHER): Payer: BLUE CROSS/BLUE SHIELD | Admitting: Neurology

## 2015-12-11 DIAGNOSIS — M542 Cervicalgia: Secondary | ICD-10-CM

## 2015-12-11 DIAGNOSIS — R202 Paresthesia of skin: Secondary | ICD-10-CM | POA: Diagnosis not present

## 2015-12-11 NOTE — Procedures (Signed)
   NCS (NERVE CONDUCTION STUDY) WITH EMG (ELECTROMYOGRAPHY) REPORT   STUDY DATE: December 11 2015 PATIENT NAME: Dawn Washington DOB: 12/30/1965 MRN: [id number]    TECHNOLOGIST: Gearldine Shown ELECTROMYOGRAPHER: Levert Feinstein M.D.  CLINICAL INFORMATION:  50 years old female, presented with intermittent right hand paresthesia over the past few months, she also has mild right-sided neck pain, radiating pain to right shoulder.  FINDINGS: NERVE CONDUCTION STUDY: Bilateral ulnar sensory and motor responses were normal. Left median sensory and motor responses were normal. Right median sensory response showed mildly prolonged peak latency, with normal snap amplitude. Right median motor response showed mildly prolonged distal distal latency, F wave latency, with normal C map amplitude and conduction velocity.  NEEDLE ELECTROMYOGRAPHY: Selected needle examinations were performed at right upper extremity muscles, right cervical paraspinal muscles.  Right abductor pollicis brevis: Normal insertion activity, no spontaneous activity, normal morphology motor unit potential, with mildly decreased recruitment patterns.  Needle examination of right pronator teres, biceps, triceps, deltoid was normal.  There was no spontaneous activity at right cervical paraspinal muscles, right C5-6 and 7.  IMPRESSION:   This is an abnormal study. 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   INTERPRETING PHYSICIAN:   Levert Feinstein M.D. Ph.D. [facility] [address] [place], [place] [address] [phone]

## 2015-12-11 NOTE — Progress Notes (Signed)
PATIENT: Dawn Washington DOB: [birth year]  No chief complaint on file.    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 She denies left upper extremity or bilateral lower extremity paresthesia or weakness.  Update December 11 2015: She returned for electrodiagnostic study today, which showed evidence of moderate right carpal tunnel syndrome, there was no evidence of right cervical radiculopathy. We have personally reviewed MRI cervical spine in February 2017: 1. At C5-6: disc bulging and facet hypertrophy with borderline/mild spinal stenosis and severe biforaminal stenosis 2. At C6-7: disc bulging and uncovertebral joint hypertrophy with borderline/mild spinal stenosis and severe biforaminal stenosis  3. At C3-4: disc bulging and facet hypertrophy with mild right foraminal stenosis  REVIEW OF SYSTEMS: Full 14 system review of systems performed and notable only for as above  ALLERGIES: Allergies  Allergen Reactions  . Adhesive [Tape] Other (See Comments)    Skin Blisters   . Latex Other (See Comments)    Skin Blisters   . Sulfonamide Derivatives Nausea Only  . Penicillins Nausea And Vomiting and Rash    HOME MEDICATIONS: Current Outpatient  Prescriptions  Medication Sig Dispense Refill  . ALPRAZolam (XANAX) 1 MG tablet Take 1 tablet (1 mg total) by mouth at bedtime as needed for anxiety. 3 tablet 0  . aspirin 81 MG tablet Take 81 mg by mouth daily.    Alphonsus Sias Pollen 550 MG CAPS Take 1 capsule by mouth daily.    Marland Kitchen CALCIUM-MAGNESIUM-ZINC PO Take 1 tablet by mouth daily.    Marland Kitchen KRILL OIL PO Take 1 tablet by mouth daily.    . Multiple Vitamin (MULTIVITAMIN) capsule Take 1 capsule by mouth daily.    . nitroGLYCERIN (NITROSTAT) 0.4 MG SL tablet Place 1 tablet (0.4 mg total) under the tongue every 5 (five) minutes as needed for chest pain. 25 tablet 2  . Probiotic Product (PROBIOTIC ADVANCED PO) Take 1 capsule by mouth daily.    . rosuvastatin (CRESTOR) 10 MG tablet Take 1 tablet (10 mg total) by mouth daily. 30 tablet 11  . TURMERIC CURCUMIN PO Take 1 capsule by mouth daily.     No current facility-administered medications for this visit.    PAST MEDICAL HISTORY: Past Medical History  Diagnosis Date  . CAD (coronary artery disease)     NSTEMI in 2011 treated with DES to the LAD;    Cardiac cath 7/20: Mid RCA ectatic, proximal and mid RCA 30-40%, distal RCA 80-90%, left main 20-30%, proximal LAD stent patent with 30-40% after the stent, D1 ostial 50%, pCFX 50%;  Status post DES to the RCA 05/01/11  . Diverticulitis   . Dyslipidemia   . Gastroesophageal reflux disease   . Myocardial infarction (HCC)   . Numbness and tingling in right hand  PAST SURGICAL HISTORY: Past Surgical History  Procedure Laterality Date  . Stents  2011;2012  . Cardiac catheterization  05/28/2011;2011;2012  . Cholecystectomy  1999  . Tubal ligation  1999  . Wisdom teeth extractiion Bilateral 1986  . Rectocele repair N/A 08/08/2013    Procedure: POSTERIOR REPAIR (RECTOCELE);  Surgeon: Tilda Burrow, MD;  Location: [facility];  Service: Gynecology;  Laterality: N/A;  . Mole removal Bilateral     2 moles removed from neck and 1 removed from her arm      FAMILY HISTORY: Family History  Problem Relation Age of Onset  . Coronary artery disease Father     Strong family history of premature CAD with her father having bypass before the age of 34  . Heart disease Father   . Diabetes Mother   . Stroke Maternal Grandmother   . Diabetes Sister     SOCIAL HISTORY:  Social History   Social History  . Marital Status: Married    Spouse Name: N/A  . Number of Children: 2  . Years of Education: 14   Occupational History  . Homemaker    Social History Main Topics  . Smoking status: Former Smoker -- 1.25 packs/day for 24 years    Types: Cigarettes    Quit date: 10/12/2001  . Smokeless tobacco: Never Used  . Alcohol Use: No  . Drug Use: No  . Sexual Activity: Not Currently    Birth Control/ Protection: Surgical   Other Topics Concern  . 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   3 cups caffeine daily.     PHYSICAL EXAM   There were no vitals filed for this visit.  Not recorded      There is no weight on file to calculate BMI.  PHYSICAL EXAMNIATION:  Gen: NAD, conversant, well nourised, obese, well groomed                     Cardiovascular: Regular rate rhythm, no peripheral edema, warm, nontender. Eyes: Conjunctivae clear without exudates or hemorrhage Neck: Supple, no carotid bruise. Pulmonary: Clear to auscultation bilaterally   NEUROLOGICAL EXAM:  MENTAL STATUS: Speech:    Speech is normal; fluent and spontaneous with normal comprehension.  Cognition:     Orientation to time, place and person     Normal recent and remote memory     Normal Attention span and concentration     Normal Language, naming, repeating,spontaneous speech     Fund of knowledge   CRANIAL NERVES: CN II: Visual fields are full to confrontation. Fundoscopic exam is normal with sharp discs and no vascular changes. Pupils are round equal and  briskly reactive to light. CN III, IV, VI: extraocular movement are normal. No ptosis. CN V: Facial sensation is intact to pinprick in all 3 divisions bilaterally. Corneal responses are intact.  CN VII: Face is symmetric with normal eye closure and smile. CN VIII: Hearing is normal to rubbing fingers CN IX, X: Palate elevates symmetrically. Phonation is normal. CN XI: Head turning and shoulder shrug are intact CN XII: Tongue is midline with normal movements and no atrophy.  MOTOR: There is no pronator drift of out-stretched arms. Muscle bulk and tone are normal. Muscle strength is normal.  REFLEXES: Reflexes are 2+ and symmetric at the biceps, triceps, knees, and ankles. Plantar responses are flexor.  SENSORY: Intact to light touch,  pinprick, position sense, and vibration sense are intact in fingers and toes, with exception of decreased light touch pinprick at right fingerpads. Involving all 5 fingers.  COORDINATION: Rapid alternating movements and fine finger movements are intact. There is no dysmetria on finger-to-nose and heel-knee-shin.    GAIT/STANCE: Posture is normal. Gait is steady with normal steps, base, arm swing, and turning. Heel and toe walking are normal. Tandem gait is normal.  Romberg is absent.   DIAGNOSTIC DATA (LABS, IMAGING, TESTING) - I reviewed patient records, labs, notes, testing and imaging myself where available.   ASSESSMENT AND PLAN  Dawn Washington is a 50 y.o. female   Moderate right carpal tunnel syndrome  I have advised her to wear a right wrist splint  As needed NSAIDs  Right neck pain, radiating pain to right shoulder,  There is evidence of cervical degenerative changes,  No evidence of active right cervical radiculopathy by EMG nerve conduction study  Neck stretching exercise, when necessary NSAIDs, hort compression    Levert Feinstein, M.D. Ph.D.  [facility] [address] [place], [place] [address] Ph: [phone] Fax: [phone]  CC: Marva Panda, NP

## 2016-02-16 ENCOUNTER — Other Ambulatory Visit: Payer: Self-pay | Admitting: Cardiovascular Disease

## 2016-06-25 ENCOUNTER — Other Ambulatory Visit: Payer: Self-pay | Admitting: Obstetrics and Gynecology

## 2016-06-25 DIAGNOSIS — Z1231 Encounter for screening mammogram for malignant neoplasm of breast: Secondary | ICD-10-CM

## 2016-07-22 ENCOUNTER — Ambulatory Visit
Admission: RE | Admit: 2016-07-22 | Discharge: 2016-07-22 | Disposition: A | Discharge status: Home / Self Care | Payer: BLUE CROSS/BLUE SHIELD | Source: Ambulatory Visit | Attending: Obstetrics and Gynecology | Admitting: Obstetrics and Gynecology

## 2016-07-22 DIAGNOSIS — Z1231 Encounter for screening mammogram for malignant neoplasm of breast: Secondary | ICD-10-CM

## 2016-08-28 ENCOUNTER — Ambulatory Visit (INDEPENDENT_AMBULATORY_CARE_PROVIDER_SITE_OTHER): Payer: BLUE CROSS/BLUE SHIELD | Admitting: Cardiovascular Disease

## 2016-08-28 ENCOUNTER — Encounter: Payer: Self-pay | Admitting: Cardiovascular Disease

## 2016-08-28 VITALS — BP 124/80 | HR 66 | Ht 68.0 in | Wt 159.8 lb

## 2016-08-28 DIAGNOSIS — I2510 Atherosclerotic heart disease of native coronary artery without angina pectoris: Secondary | ICD-10-CM

## 2016-08-28 DIAGNOSIS — E7800 Pure hypercholesterolemia, unspecified: Secondary | ICD-10-CM

## 2016-08-28 MED ORDER — ROSUVASTATIN CALCIUM 10 MG PO TABS: 10.0000 mg | ORAL_TABLET | Freq: Every day | ORAL | 11 refills | Status: DC

## 2016-08-28 NOTE — Patient Instructions (Signed)

## 2016-08-28 NOTE — Progress Notes (Signed)
Cardiology Office Note Date:  08/28/2016   ID:  Dawn MckusickJanet Fontaine, DOB 10/23/1965, MRN [id number]  PCP:  Egbert GaribaldiMillsaps, KIMBERLY M, NP  Cardiologist:  Tonny Bollmanooper, Kacee Koren, MD    Chief Complaint  Patient presents with  . Coronary Artery Disease     History of Present Illness: Dawn Washington is a 50 y.o. female who presents for follow-up evaluation.   The patient has coronary artery disease and initially presented in 2011 with a non-ST elevation MI. She underwent PCI of a critical stenosis in her proximal LAD. She had recurrent unstable angina and had a new lesion in the distal right coronary artery in 2012 treated again with a drug-eluting stent. She had a followup cardiac catheterization after again developing chest pain and this demonstrated patency of both of her stent sites.  A stress echo was done in 2015 and this was normal.   The patient is here today. Her mother died suddenly and unexpectedly last week. States she's had a "lump in her chest" since then. She has not had any exertional angina. She's been out of work taking care of different family members. Her [facility]. Fortunately she is doing pretty well.  The patient denies exertional chest pain or pressure, shortness of breath, leg swelling, or heart palpitations. She has been compliant with her medications. Lab work dated 11/23/2015 shows a hemoglobin of 14.4, platelet count 325,000, creatinine 0.66, glucose 97, cholesterol 188, HDL 77, LDL 77, triglycerides 168.  Past Medical History:  Diagnosis Date  . CAD (coronary artery disease)    NSTEMI in 2011 treated with DES to the LAD;    Cardiac cath 7/20: Mid RCA ectatic, proximal and mid RCA 30-40%, distal RCA 80-90%, left main 20-30%, proximal LAD stent patent with 30-40% after the stent, D1 ostial 50%, pCFX 50%;  Status post DES to the RCA 05/01/11  . Diverticulitis   . Dyslipidemia   . Gastroesophageal reflux disease   . Myocardial infarction   .  Numbness and tingling in right hand     Past Surgical History:  Procedure Laterality Date  . CARDIAC CATHETERIZATION  05/28/2011;2011;2012  . CHOLECYSTECTOMY  1999  . MOLE REMOVAL Bilateral    2 moles removed from neck and 1 removed from her arm   . RECTOCELE REPAIR N/A 08/08/2013   Procedure: POSTERIOR REPAIR (RECTOCELE);  Surgeon: Tilda BurrowJohn V Ferguson, MD;  Location: [facility];  Service: Gynecology;  Laterality: N/A;  . Stents  2011;2012  . TUBAL LIGATION  1999  . Wisdom Teeth Extractiion Bilateral 1986    Current Outpatient Prescriptions  Medication Sig Dispense Refill  . ALPRAZolam (XANAX) 1 MG tablet Take 1 tablet (1 mg total) by mouth at bedtime as needed for anxiety. 3 tablet 0  . aspirin 81 MG tablet Take 81 mg by mouth daily.    Alphonsus Sias. Bee Pollen 550 MG CAPS Take 1 capsule by mouth daily.    Marland Kitchen. CALCIUM-MAGNESIUM-ZINC PO Take 1 tablet by mouth daily.    Marland Kitchen. KRILL OIL PO Take 1 tablet by mouth daily.    . Multiple Vitamin (MULTIVITAMIN) capsule Take 1 capsule by mouth daily.    . nitroGLYCERIN (NITROSTAT) 0.4 MG SL tablet Place 1 tablet (0.4 mg total) under the tongue every 5 (five) minutes as needed for chest pain. 25 tablet 2  . Probiotic Product (PROBIOTIC ADVANCED PO) Take 1 capsule by mouth daily.    . rosuvastatin (CRESTOR) 10 MG tablet Take 1 tablet (10 mg total) by mouth  daily. 30 tablet 11  . TURMERIC CURCUMIN PO Take 1 capsule by mouth daily.     No current facility-administered medications for this visit.     Allergies:   Adhesive [tape]; Latex; Sulfonamide derivatives; and Penicillins   Social History:  The patient  reports that she quit smoking about 14 years ago. Her smoking use included Cigarettes. She has a 30.00 pack-year smoking history. She has never used smokeless tobacco. She reports that she does not drink alcohol or use drugs.   Family History:  The patient's  family history includes Coronary artery disease in her father; Diabetes in her mother and sister; Heart  disease in her father; Stroke in her maternal grandmother.    ROS:  Please see the history of present illness. All other systems are reviewed and negative.    PHYSICAL EXAM: VS:  BP 124/80   Pulse 66   Ht 5\' 8"  (1.727 m)   Wt 159 lb 12.8 oz (72.5 kg)   BMI 24.30 kg/m  , BMI Body mass index is 24.3 kg/m. GEN: Well nourished, well developed, in no acute distress  HEENT: normal  Neck: no JVD, no masses. No carotid bruits Cardiac: RRR without murmur or gallop                Respiratory:  clear to auscultation bilaterally, normal work of breathing GI: soft, nontender, nondistended, + BS MS: no deformity or atrophy  Ext: no pretibial edema, pedal pulses 2+= bilaterally Skin: warm and dry, no rash Neuro:  Strength and sensation are intact Psych: euthymic mood, full affect  EKG:  EKG is ordered today. The ekg ordered today shows normal sinus rhythm 67 bpm, within normal limits  Recent Labs: No results found for requested labs within last 8760 hours.   Lipid Panel     Component Value Date/Time   CHOL 167 07/22/2015 1116   TRIG 71 07/22/2015 1116   HDL 74 07/22/2015 1116   CHOLHDL 2.3 07/22/2015 1116   VLDL 14 07/22/2015 1116   LDLCALC 79 07/22/2015 1116   LDLDIRECT 159.4 05/02/2013 1136      Wt Readings from Last 3 Encounters:  08/28/16 159 lb 12.8 oz (72.5 kg)  07/10/15 151 lb (68.5 kg)  04/17/15 159 lb 6.4 oz (72.3 kg)     ASSESSMENT AND PLAN: 1.  CAD, native vessel: Patient is doing well without angina. She will continue on aspirin and Crestor with dose limited by side effects.  2. Hyperlipidemia: Most recent lipids reviewed as outlined above.  Current medicines are reviewed with the patient today.  The patient does not have concerns regarding medicines.  Labs/ tests ordered today include:   Orders Placed This Encounter  Procedures  . EKG 12-Lead   Disposition:   FU one year  Signed, Tonny Bollmanooper, Dominga Mcduffie, MD  08/28/2016 5:19 PM    [facility] [address], [place], [place]  [address] Phone: [phone](336) [phone]; Fax: [phone](336) [phone]

## 2017-06-22 ENCOUNTER — Ambulatory Visit (INDEPENDENT_AMBULATORY_CARE_PROVIDER_SITE_OTHER): Payer: BLUE CROSS/BLUE SHIELD | Admitting: Neurology

## 2017-06-22 ENCOUNTER — Encounter: Payer: Self-pay | Admitting: Neurology

## 2017-06-22 VITALS — BP 158/93 | HR 64 | Ht 68.0 in | Wt 163.5 lb

## 2017-06-22 DIAGNOSIS — G5601 Carpal tunnel syndrome, right upper limb: Secondary | ICD-10-CM | POA: Diagnosis not present

## 2017-06-22 MED ORDER — DICLOFENAC SODIUM 3 % TD GEL: 4.0000 g | TRANSDERMAL | 6 refills | Status: DC | PRN

## 2017-06-22 MED ORDER — MELOXICAM 15 MG PO TABS: 15.0000 mg | ORAL_TABLET | Freq: Every day | ORAL | 6 refills | Status: DC

## 2017-06-22 NOTE — Patient Instructions (Signed)
Orthopedic and hand clinic  Inquiries or Appointments: [phone]) [phone]  Therapy Center: [phone]

## 2017-06-22 NOTE — Progress Notes (Signed)
PATIENT: Dawn Washington DOB: 01-29-1966  Chief Complaint  Patient presents with  . Numbness    She is here for worsening of her right hand numbness.  She never followed up with an orthopaedic physician to discuss her carpal tunnel syndrome.  . 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 She denies left upper extremity or bilateral lower extremity paresthesia or weakness.  UPDATE Sept 11 2018:  Have personally reviewed MRI cervical in Feb 2018: There is mild degenerative changes, severe bilateral foraminal narrowing at C5-6, there is no significant canal stenosis  EMG/NCS in March 2018, showed moderate right carpal tunnel syndromes,  Now she has constant right fifth and fourth fingers numbness, also complains of right thumb metaphalangeal joints tenderness upon deep palpitation she denies right elbow pain,  REVIEW OF SYSTEMS: Full 14 system review of systems performed and notable only for as above  ALLERGIES: Allergies  Allergen Reactions  . Adhesive [Tape] Other (See Comments)    Skin Blisters   . Latex Other (See Comments)    Skin Blisters   . Sulfonamide Derivatives Nausea Only  . Penicillins Nausea And Vomiting and Rash     HOME MEDICATIONS: Current Outpatient Prescriptions  Medication Sig Dispense Refill  . aspirin 81 MG tablet Take 81 mg by mouth daily.    Marland Kitchen CALCIUM-MAGNESIUM-ZINC PO Take 1 tablet by mouth daily.    Marland Kitchen KRILL OIL PO Take 1 tablet by mouth daily.    . Multiple Vitamin (MULTIVITAMIN) capsule Take 1 capsule by mouth daily.    . nitroGLYCERIN (NITROSTAT) 0.4 MG SL tablet Place 1 tablet (0.4 mg total) under the tongue every 5 (five) minutes as needed for chest pain. 25 tablet 2  . Probiotic Product (PROBIOTIC ADVANCED PO) Take 1 capsule by mouth daily.    . rosuvastatin (CRESTOR) 10 MG tablet Take 1 tablet (10 mg total) by mouth daily. 30 tablet 11  . TURMERIC CURCUMIN PO Take 1 capsule by mouth daily.     No current facility-administered medications for this visit.     PAST MEDICAL HISTORY: Past Medical History:  Diagnosis Date  . CAD (coronary artery disease)    NSTEMI in 2011 treated with DES to the LAD;    Cardiac cath 7/20: Mid RCA ectatic, proximal and mid RCA 30-40%, distal RCA 80-90%, left main 20-30%, proximal LAD stent patent with 30-40% after the stent, D1 ostial 50%, pCFX 50%;  Status post DES to the RCA 05/01/11  . Diverticulitis   . Dyslipidemia   . Gastroesophageal reflux disease   . Myocardial infarction (HCC)   . Numbness and tingling in right hand     PAST SURGICAL HISTORY: Past Surgical History:  Procedure Laterality Date  . CARDIAC CATHETERIZATION  05/28/2011;2011;2012  . CHOLECYSTECTOMY  1999  . MOLE REMOVAL Bilateral    2 moles removed from neck and 1 removed from her arm   . RECTOCELE REPAIR N/A 08/08/2013   Procedure: POSTERIOR REPAIR (RECTOCELE);  Surgeon: Tilda Burrow, MD;  Location: [facility];  Service: Gynecology;  Laterality: N/A;  . Stents  2011;2012  . TUBAL LIGATION  1999  . Wisdom Teeth Extractiion Bilateral 1986    FAMILY HISTORY: Family History  Problem Relation Age of Onset  . Coronary artery disease Father        Strong family history of  premature CAD with her father having bypass before the age of 49  . Heart disease Father   . Diabetes Mother   . Stroke Maternal Grandmother   . Diabetes Sister     SOCIAL HISTORY:  Social History   Social History  . Marital status: Married    Spouse name: N/A  . Number of children: 2  . Years of education: 14   Occupational History  . Homemaker    Social History Main Topics  . Smoking status: Former Smoker    Packs/day: 1.25    Years: 24.00    Types: Cigarettes    Quit date: 10/12/2001  . Smokeless tobacco: Never Used  . Alcohol use No  . Drug use: No  . Sexual activity: Not Currently    Birth control/ protection: Surgical   Other Topics Concern  . 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   3 cups caffeine daily.     PHYSICAL EXAM   Vitals:   06/22/17 1147  BP: (!) 158/93  Pulse: 64  Weight: 163 lb 8 oz (74.2 kg)  Height:  (1.727 m)    Not recorded      Body mass index is 24.86 kg/m.  PHYSICAL EXAMNIATION:  Gen: NAD, conversant, well nourised, obese, well groomed                     Cardiovascular: Regular rate rhythm, no peripheral edema, warm, nontender. Eyes: Conjunctivae clear without exudates or hemorrhage Neck: Supple, no carotid bruise. Pulmonary: Clear to auscultation bilaterally   NEUROLOGICAL EXAM:  MENTAL STATUS: Speech:    Speech is normal; fluent and spontaneous with normal comprehension.  Cognition:     Orientation to time, place and person     Normal recent and remote memory     Normal Attention span and concentration     Normal Language, naming, repeating,spontaneous speech     Fund of knowledge   CRANIAL NERVES: CN II: Visual fields are full to confrontation. Fundoscopic exam is normal with sharp discs and no vascular changes. Pupils are round equal and briskly reactive to light. CN III, IV, VI: extraocular movement are normal. No  ptosis. CN V: Facial sensation is intact to pinprick in all 3 divisions bilaterally. Corneal responses are intact.  CN VII: Face is symmetric with normal eye closure and smile. CN VIII: Hearing is normal to rubbing fingers CN IX, X: Palate elevates symmetrically. Phonation is normal. CN XI: Head turning and shoulder shrug are intact CN XII: Tongue is midline with normal movements and no atrophy.  MOTOR: There is no pronator drift of out-stretched arms. Muscle bulk and tone are normal. Muscle strength is normal.Right thumb metaphalangeal joints pain upon deep palpitation  REFLEXES: Reflexes are 2+ and symmetric at the biceps, triceps, knees, and ankles. Plantar  responses are flexor.  SENSORY: Intact to light touch, pinprick, position sense, and vibration sense are intact in fingers and toes, with exception of decreased light touch pinprick at right fingerpads. Involving all 5 fingers.  COORDINATION: Rapid alternating movements and fine finger movements are intact. There is no dysmetria on finger-to-nose and heel-knee-shin.    GAIT/STANCE: Posture is normal. Gait is steady with normal steps, base, arm swing, and turning. Heel and toe walking are normal. Tandem gait is normal.  Romberg is absent.   DIAGNOSTIC DATA (LABS, IMAGING, TESTING) - I reviewed patient records, labs, notes, testing and imaging myself where available.   ASSESSMENT AND PLAN  Dawn Washington is a 51 y.o. female   Right moderate carpal tunnel syndromes Her current complaints of right fourth fifth fingers paresthesia does not consistent with right carpal tunnel Right thumb metaphalangeal joints pain, suggested local musculoskeletal etiology  I have suggested her right wrist splint,  NSAIDs.  Refer her to hand surgeon     Levert FeinsteinYijun Tameah Mihalko, M.D. Ph.D.  [facility] [address], [address] [place], [place] [address] Ph: [phone]([phone] Fax: [phone]  CC: Marva PandaKimberly Millsaps, NP

## 2017-06-23 ENCOUNTER — Encounter: Payer: Self-pay | Admitting: *Deleted

## 2017-07-20 ENCOUNTER — Other Ambulatory Visit: Payer: Self-pay | Admitting: Obstetrics and Gynecology

## 2017-07-20 DIAGNOSIS — Z1231 Encounter for screening mammogram for malignant neoplasm of breast: Secondary | ICD-10-CM

## 2017-07-27 ENCOUNTER — Ambulatory Visit
Admission: RE | Admit: 2017-07-27 | Discharge: 2017-07-27 | Disposition: A | Discharge status: Home / Self Care | Payer: BLUE CROSS/BLUE SHIELD | Source: Ambulatory Visit | Attending: Obstetrics and Gynecology | Admitting: Obstetrics and Gynecology

## 2017-07-27 DIAGNOSIS — Z1231 Encounter for screening mammogram for malignant neoplasm of breast: Secondary | ICD-10-CM

## 2017-08-09 ENCOUNTER — Other Ambulatory Visit: Payer: Self-pay | Admitting: Cardiovascular Disease

## 2017-08-19 ENCOUNTER — Other Ambulatory Visit: Payer: Self-pay | Admitting: Orthopedic Surgery

## 2017-08-25 ENCOUNTER — Other Ambulatory Visit: Payer: Self-pay

## 2017-08-25 ENCOUNTER — Encounter (HOSPITAL_BASED_OUTPATIENT_CLINIC_OR_DEPARTMENT_OTHER): Payer: Self-pay | Admitting: *Deleted

## 2017-08-30 MED ORDER — CHLORHEXIDINE GLUCONATE 4 % EX LIQD: 60.0000 mL | Freq: Once | CUTANEOUS | Status: DC

## 2017-08-30 MED ORDER — CLINDAMYCIN PHOSPHATE 900 MG/50ML IV SOLN
900.0000 mg | INTRAVENOUS | Status: AC
  Administered 2017-08-31: 900 mg via INTRAVENOUS

## 2017-08-31 ENCOUNTER — Ambulatory Visit (HOSPITAL_BASED_OUTPATIENT_CLINIC_OR_DEPARTMENT_OTHER): Payer: BLUE CROSS/BLUE SHIELD | Admitting: Anesthesiology

## 2017-08-31 ENCOUNTER — Encounter (HOSPITAL_BASED_OUTPATIENT_CLINIC_OR_DEPARTMENT_OTHER): Payer: Self-pay | Admitting: Anesthesiology

## 2017-08-31 ENCOUNTER — Ambulatory Visit (HOSPITAL_BASED_OUTPATIENT_CLINIC_OR_DEPARTMENT_OTHER)
Admission: RE | Admit: 2017-08-31 | Discharge: 2017-08-31 | Disposition: A | Discharge status: Home / Self Care | Payer: BLUE CROSS/BLUE SHIELD | Source: Ambulatory Visit | Attending: Orthopedic Surgery | Admitting: Orthopedic Surgery

## 2017-08-31 ENCOUNTER — Encounter (HOSPITAL_BASED_OUTPATIENT_CLINIC_OR_DEPARTMENT_OTHER)
Admission: RE | Disposition: A | Discharge status: Home / Self Care | Payer: Self-pay | Source: Ambulatory Visit | Attending: Orthopedic Surgery

## 2017-08-31 ENCOUNTER — Other Ambulatory Visit: Payer: Self-pay

## 2017-08-31 DIAGNOSIS — Z79899 Other long term (current) drug therapy: Secondary | ICD-10-CM | POA: Insufficient documentation

## 2017-08-31 DIAGNOSIS — Z955 Presence of coronary angioplasty implant and graft: Secondary | ICD-10-CM | POA: Insufficient documentation

## 2017-08-31 DIAGNOSIS — Z880 Allergy status to penicillin: Secondary | ICD-10-CM | POA: Insufficient documentation

## 2017-08-31 DIAGNOSIS — Z882 Allergy status to sulfonamides status: Secondary | ICD-10-CM | POA: Diagnosis not present

## 2017-08-31 DIAGNOSIS — Z87891 Personal history of nicotine dependence: Secondary | ICD-10-CM | POA: Insufficient documentation

## 2017-08-31 DIAGNOSIS — I252 Old myocardial infarction: Secondary | ICD-10-CM | POA: Diagnosis not present

## 2017-08-31 DIAGNOSIS — K219 Gastro-esophageal reflux disease without esophagitis: Secondary | ICD-10-CM | POA: Insufficient documentation

## 2017-08-31 DIAGNOSIS — E785 Hyperlipidemia, unspecified: Secondary | ICD-10-CM | POA: Insufficient documentation

## 2017-08-31 DIAGNOSIS — G5601 Carpal tunnel syndrome, right upper limb: Secondary | ICD-10-CM | POA: Insufficient documentation

## 2017-08-31 DIAGNOSIS — Z7982 Long term (current) use of aspirin: Secondary | ICD-10-CM | POA: Diagnosis not present

## 2017-08-31 DIAGNOSIS — I2510 Atherosclerotic heart disease of native coronary artery without angina pectoris: Secondary | ICD-10-CM | POA: Diagnosis not present

## 2017-08-31 HISTORY — PX: CARPAL TUNNEL RELEASE: SHX101

## 2017-08-31 SURGERY — CARPAL TUNNEL RELEASE
Anesthesia: General | Site: Wrist | Laterality: Right

## 2017-08-31 MED ORDER — FENTANYL CITRATE (PF) 100 MCG/2ML IJ SOLN
50.0000 ug | INTRAMUSCULAR | Status: DC | PRN
  Administered 2017-08-31: 100 ug via INTRAVENOUS

## 2017-08-31 MED ORDER — HYDROCODONE-ACETAMINOPHEN 5-325 MG PO TABS: ORAL_TABLET | ORAL | 0 refills | Status: DC

## 2017-08-31 MED ORDER — DEXAMETHASONE SODIUM PHOSPHATE 10 MG/ML IJ SOLN
INTRAMUSCULAR | Status: DC | PRN
  Administered 2017-08-31: 10 mg via INTRAVENOUS

## 2017-08-31 MED ORDER — BUPIVACAINE HCL (PF) 0.25 % IJ SOLN
INTRAMUSCULAR | Status: DC | PRN
Start: 2017-08-31 — End: 2017-08-31
  Administered 2017-08-31: 10 mL

## 2017-08-31 MED ORDER — PROPOFOL 10 MG/ML IV BOLUS
INTRAVENOUS | Status: DC | PRN
Start: 2017-08-31 — End: 2017-08-31
  Administered 2017-08-31: 200 mg via INTRAVENOUS

## 2017-08-31 MED ORDER — MIDAZOLAM HCL 2 MG/2ML IJ SOLN
INTRAMUSCULAR | Status: AC
  Filled 2017-08-31: qty 2

## 2017-08-31 MED ORDER — FENTANYL CITRATE (PF) 100 MCG/2ML IJ SOLN: 25.0000 ug | INTRAMUSCULAR | Status: DC | PRN

## 2017-08-31 MED ORDER — BUPIVACAINE HCL (PF) 0.25 % IJ SOLN
INTRAMUSCULAR | Status: AC
  Filled 2017-08-31: qty 30

## 2017-08-31 MED ORDER — MIDAZOLAM HCL 2 MG/2ML IJ SOLN
1.0000 mg | INTRAMUSCULAR | Status: DC | PRN
  Administered 2017-08-31: 2 mg via INTRAVENOUS

## 2017-08-31 MED ORDER — LACTATED RINGERS IV SOLN
INTRAVENOUS | Status: DC
  Administered 2017-08-31 (×2): via INTRAVENOUS

## 2017-08-31 MED ORDER — PROMETHAZINE HCL 25 MG/ML IJ SOLN: 6.2500 mg | INTRAMUSCULAR | Status: DC | PRN

## 2017-08-31 MED ORDER — CLINDAMYCIN PHOSPHATE 900 MG/50ML IV SOLN
INTRAVENOUS | Status: AC
  Filled 2017-08-31: qty 50

## 2017-08-31 MED ORDER — FENTANYL CITRATE (PF) 100 MCG/2ML IJ SOLN
INTRAMUSCULAR | Status: AC
  Filled 2017-08-31: qty 2

## 2017-08-31 MED ORDER — SCOPOLAMINE 1 MG/3DAYS TD PT72: 1.0000 | MEDICATED_PATCH | Freq: Once | TRANSDERMAL | Status: DC | PRN

## 2017-08-31 MED ORDER — ONDANSETRON HCL 4 MG/2ML IJ SOLN
INTRAMUSCULAR | Status: DC | PRN
  Administered 2017-08-31: 4 mg via INTRAVENOUS

## 2017-08-31 MED ORDER — LIDOCAINE HCL (CARDIAC) 20 MG/ML IV SOLN
INTRAVENOUS | Status: DC | PRN
  Administered 2017-08-31: 50 mg via INTRAVENOUS

## 2017-08-31 SURGICAL SUPPLY — 37 items
BANDAGE ACE 3X5.8 VEL STRL LF (GAUZE/BANDAGES/DRESSINGS) ×3 IMPLANT
BLADE SURG 15 STRL LF DISP TIS (BLADE) ×2 IMPLANT
BLADE SURG 15 STRL SS (BLADE) ×4
BNDG ESMARK 4X9 LF (GAUZE/BANDAGES/DRESSINGS) IMPLANT
BNDG GAUZE ELAST 4 BULKY (GAUZE/BANDAGES/DRESSINGS) ×3 IMPLANT
CHLORAPREP W/TINT 26ML (MISCELLANEOUS) ×3 IMPLANT
CORD BIPOLAR FORCEPS 12FT (ELECTRODE) ×3 IMPLANT
COVER BACK TABLE 60X90IN (DRAPES) ×3 IMPLANT
COVER MAYO STAND STRL (DRAPES) ×3 IMPLANT
CUFF TOURNIQUET SINGLE 18IN (TOURNIQUET CUFF) ×3 IMPLANT
DRAPE EXTREMITY T 121X128X90 (DRAPE) ×3 IMPLANT
DRAPE SURG 17X23 STRL (DRAPES) ×3 IMPLANT
DRSG PAD ABDOMINAL 8X10 ST (GAUZE/BANDAGES/DRESSINGS) ×3 IMPLANT
GAUZE SPONGE 4X4 12PLY STRL (GAUZE/BANDAGES/DRESSINGS) ×3 IMPLANT
GAUZE XEROFORM 1X8 LF (GAUZE/BANDAGES/DRESSINGS) ×3 IMPLANT
GLOVE BIO SURGEON STRL SZ7.5 (GLOVE) ×3 IMPLANT
GLOVE BIOGEL PI IND STRL 7.0 (GLOVE) ×1 IMPLANT
GLOVE BIOGEL PI IND STRL 8 (GLOVE) ×1 IMPLANT
GLOVE BIOGEL PI INDICATOR 7.0 (GLOVE) ×2
GLOVE BIOGEL PI INDICATOR 8 (GLOVE) ×2
GLOVE SKINSENSE NS SZ7.5 (GLOVE) ×4
GLOVE SKINSENSE STRL SZ7.5 (GLOVE) ×2 IMPLANT
GLOVE SURG SS PI 6.5 STRL IVOR (GLOVE) ×3 IMPLANT
GOWN STRL REUS W/ TWL LRG LVL3 (GOWN DISPOSABLE) ×1 IMPLANT
GOWN STRL REUS W/TWL LRG LVL3 (GOWN DISPOSABLE) ×2
GOWN STRL REUS W/TWL XL LVL3 (GOWN DISPOSABLE) ×3 IMPLANT
NEEDLE HYPO 25X1 1.5 SAFETY (NEEDLE) ×3 IMPLANT
NS IRRIG 1000ML POUR BTL (IV SOLUTION) ×3 IMPLANT
PACK BASIN DAY SURGERY FS (CUSTOM PROCEDURE TRAY) ×3 IMPLANT
PADDING CAST ABS 4INX4YD NS (CAST SUPPLIES) ×2
PADDING CAST ABS COTTON 4X4 ST (CAST SUPPLIES) ×1 IMPLANT
STOCKINETTE 4X48 STRL (DRAPES) ×3 IMPLANT
SUT ETHILON 4 0 PS 2 18 (SUTURE) ×3 IMPLANT
SYR BULB 3OZ (MISCELLANEOUS) ×3 IMPLANT
SYR CONTROL 10ML LL (SYRINGE) ×3 IMPLANT
TOWEL OR 17X24 6PK STRL BLUE (TOWEL DISPOSABLE) ×6 IMPLANT
UNDERPAD 30X30 (UNDERPADS AND DIAPERS) ×3 IMPLANT

## 2017-08-31 NOTE — Anesthesia Postprocedure Evaluation (Signed)
Anesthesia Post Note  Patient: Dawn JarvisJanet [place] Washington  Procedure(s) Performed: RIGHT CARPAL TUNNEL RELEASE (Right Wrist)     Patient location during evaluation: PACU Anesthesia Type: General Level of consciousness: awake and alert Pain management: pain level controlled Vital Signs Assessment: post-procedure vital signs reviewed and stable Respiratory status: spontaneous breathing, nonlabored ventilation, respiratory function stable and patient connected to nasal cannula oxygen Cardiovascular status: blood pressure returned to baseline and stable Postop Assessment: no apparent nausea or vomiting Anesthetic complications: no    Last Vitals:  Vitals:   08/31/17 1316 08/31/17 1511  BP: 124/83 (!) 135/91  Pulse: 68 85  Resp: 18 20  Temp: 36.9 C (!) 36.3 C  SpO2: 99% 100%    Last Pain:  Vitals:   08/31/17 1511  TempSrc:   PainSc: Asleep                 Dolphus Linch S

## 2017-08-31 NOTE — Anesthesia Preprocedure Evaluation (Signed)
Anesthesia Evaluation  Patient identified by MRN, date of birth, ID band Patient awake    Reviewed: Allergy & Precautions, NPO status , Patient's Chart, lab work & pertinent test results  Airway Mallampati: II  TM Distance: >3 FB Neck ROM: Full    Dental no notable dental hx.    Pulmonary neg pulmonary ROS, former smoker,    Pulmonary exam normal breath sounds clear to auscultation       Cardiovascular + CAD, + Past MI and + Cardiac Stents  Normal cardiovascular exam Rhythm:Regular Rate:Normal     Neuro/Psych negative neurological ROS  negative psych ROS   GI/Hepatic Neg liver ROS, GERD  ,  Endo/Other  negative endocrine ROS  Renal/GU negative Renal ROS  negative genitourinary   Musculoskeletal negative musculoskeletal ROS (+)   Abdominal   Peds negative pediatric ROS (+)  Hematology negative hematology ROS (+)   Anesthesia Other Findings   Reproductive/Obstetrics negative OB ROS                             Anesthesia Physical Anesthesia Plan  ASA: III  Anesthesia Plan: General   Post-op Pain Management:    Induction: Intravenous  PONV Risk Score and Plan: 3 and Ondansetron, Dexamethasone and Treatment may vary due to age or medical condition  Airway Management Planned: LMA  Additional Equipment:   Intra-op Plan:   Post-operative Plan:   Informed Consent: I have reviewed the patients History and Physical, chart, labs and discussed the procedure including the risks, benefits and alternatives for the proposed anesthesia with the patient or authorized representative who has indicated his/her understanding and acceptance.   Dental advisory given  Plan Discussed with: CRNA and Surgeon  Anesthesia Plan Comments:         Anesthesia Quick Evaluation

## 2017-08-31 NOTE — Discharge Instructions (Addendum)

## 2017-08-31 NOTE — Transfer of Care (Signed)
Immediate Anesthesia Transfer of Care Note  Patient: Dawn JarvisJanet [place]  Procedure(s) Performed: RIGHT CARPAL TUNNEL RELEASE (Right Wrist)  Patient Location: PACU  Anesthesia Type:General  Level of Consciousness: awake and drowsy  Airway & Oxygen Therapy: Patient Spontanous Breathing and Patient connected to face mask oxygen  Post-op Assessment: Report given to RN and Post -op Vital signs reviewed and stable  Post vital signs: Reviewed and stable  Last Vitals:  Vitals:   08/31/17 1316 08/31/17 1511  BP: 124/83 (!) (P) 135/91  Pulse: 68 (P) 85  Resp: 18 (P) 20  Temp: 36.9 C (!) (P) 36.3 C  SpO2: 99%     Last Pain:  Vitals:   08/31/17 1316  TempSrc: Oral         Complications: No apparent anesthesia complications

## 2017-08-31 NOTE — Anesthesia Procedure Notes (Signed)
Procedure Name: LMA Insertion Date/Time: 08/31/2017 2:36 PM Performed by: Ronnette HilaPayne, Teagen Bucio D, CRNA Pre-anesthesia Checklist: Patient identified, Emergency Drugs available, Suction available and Patient being monitored Patient Re-evaluated:Patient Re-evaluated prior to induction Oxygen Delivery Method: Circle system utilized Preoxygenation: Pre-oxygenation with 100% oxygen Induction Type: IV induction Ventilation: Mask ventilation without difficulty LMA: LMA with gastric port inserted LMA Size: 4.0 Grade View: Grade II Number of attempts: 3 Airway Equipment and Method: Bite block Placement Confirmation: positive ETCO2 Tube secured with: Tape Dental Injury: Teeth and Oropharynx as per pre-operative assessment

## 2017-08-31 NOTE — H&P (Signed)
Dawn JarvisJanet [place] is an 51 y.o. female.   Chief Complaint: right carpal tunnel syndrome HPI: 51 yo female with numbness and tingling right hand x 2 years.  Nocturnal symptoms.  Positive nerve conduction studies.  This is bothersome to her and she wishes to have a right carpal tunnel release.  Allergies:  Allergies  Allergen Reactions  . Adhesive [Tape] Other (See Comments)    Skin Blisters   . Latex Other (See Comments)    Skin Blisters   . Sulfonamide Derivatives Nausea Only  . Penicillins Nausea And Vomiting and Rash    Past Medical History:  Diagnosis Date  . CAD (coronary artery disease)    NSTEMI in 2011 treated with DES to the LAD;    Cardiac cath 7/20: Mid RCA ectatic, proximal and mid RCA 30-40%, distal RCA 80-90%, left main 20-30%, proximal LAD stent patent with 30-40% after the stent, D1 ostial 50%, pCFX 50%;  Status post DES to the RCA 05/01/11  . Diverticulitis   . Dyslipidemia   . Gastroesophageal reflux disease   . Myocardial infarction (HCC)   . Numbness and tingling in right hand     Past Surgical History:  Procedure Laterality Date  . CARDIAC CATHETERIZATION  05/28/2011;2011;2012  . CHOLECYSTECTOMY  1999  . MOLE REMOVAL Bilateral    2 moles removed from neck and 1 removed from her arm   . RECTOCELE REPAIR N/A 08/08/2013   Procedure: POSTERIOR REPAIR (RECTOCELE);  Surgeon: Tilda BurrowJohn V Ferguson, MD;  Location: [facility];  Service: Gynecology;  Laterality: N/A;  . Stents  2011;2012  . TUBAL LIGATION  1999  . Wisdom Teeth Extractiion Bilateral 1986    Family History: Family History  Problem Relation Age of Onset  . Coronary artery disease Father        Strong family history of premature CAD with her father having bypass before the age of 51  . Heart disease Father   . Diabetes Mother   . Stroke Maternal Grandmother   . Diabetes Sister     Social History:   reports that she quit smoking about 15 years ago. Her smoking use included cigarettes. She has a 30.00  pack-year smoking history. she has never used smokeless tobacco. She reports that she does not drink alcohol or use drugs.  Medications: Medications Prior to Admission  Medication Sig Dispense Refill  . aspirin 81 MG tablet Take 81 mg by mouth daily.    Marland Kitchen. CALCIUM-MAGNESIUM-ZINC PO Take 1 tablet by mouth daily.    . Multiple Vitamin (MULTIVITAMIN) capsule Take 1 capsule by mouth daily.    . Probiotic Product (PROBIOTIC ADVANCED PO) Take 1 capsule by mouth daily.    . rosuvastatin (CRESTOR) 10 MG tablet Take 1 tablet (10 mg total) by mouth daily. Please keep upcoming appt with Dr. Excell Seltzerooper for future refills. Thanks 30 tablet 1  . nitroGLYCERIN (NITROSTAT) 0.4 MG SL tablet Place 1 tablet (0.4 mg total) under the tongue every 5 (five) minutes as needed for chest pain. 25 tablet 2    No results found for this or any previous visit (from the past 48 hour(s)).  No results found.   A comprehensive review of systems was negative except for: Musculoskeletal: positive for neck pain Neurological: positive for paresthesia  Height 5\' 8"  (1.727 m), weight 73.9 kg (163 lb), last menstrual period 11/26/2015.  General appearance: alert, cooperative and appears stated age Head: Normocephalic, without obvious abnormality, atraumatic Neck: supple, symmetrical, trachea midline Resp: clear to auscultation bilaterally  Cardio: regular rate and rhythm GI: non-tender Extremities: Intact sensation and capillary refill all digits.  +epl/fpl/io.  No wounds.  Pulses: 2+ and symmetric Skin: Skin color, texture, turgor normal. No rashes or lesions Neurologic: Grossly normal Incision/Wound:none  Assessment/Plan Right carpal tunnel syndrome.  Non operative and operative treatment options were discussed with the patient and patient wishes to proceed with operative treatment. Risks, benefits, and alternatives of surgery were discussed and the patient agrees with the plan of care.   Mahlani Berninger R 08/31/2017, 12:53  PM

## 2017-08-31 NOTE — Op Note (Signed)
08/31/2017 [facility]                              OPERATIVE REPORT   PREOPERATIVE DIAGNOSIS:  Right carpal tunnel syndrome.  POSTOPERATIVE DIAGNOSIS:  Right carpal tunnel syndrome.  PROCEDURE:  Right carpal tunnel release.  SURGEON:  Betha LoaKevin Kainen Struckman, MD  ASSISTANT:  none.  ANESTHESIA:  General.  IV FLUIDS:  Per anesthesia flow sheet.  ESTIMATED BLOOD LOSS:  Minimal.  COMPLICATIONS:  None.  SPECIMENS:  None.  TOURNIQUET TIME:    Total Tourniquet Time Documented: Upper Arm (Right) - 14 minutes Total: Upper Arm (Right) - 14 minutes   DISPOSITION:  Stable to PACU.  LOCATION: [facility]  INDICATIONS:  51 yo female with numbness and tingling of finger and positive nerve conduction studies.  She wishes to have a carpal tunnel release for management of her symptoms.  Risks, benefits and alternatives of surgery were discussed including the risk of blood loss; infection; damage to nerves, vessels, tendons, ligaments, bone; failure of surgery; need for additional surgery; complications with wound healing; continued pain; recurrence of carpal tunnel syndrome; and damage to motor branch. She voiced understanding of these risks and elected to proceed.   OPERATIVE COURSE:  After being identified preoperatively by myself, the patient and I agreed upon the procedure and site of procedure.  The surgical site was marked.  The risks, benefits, and alternatives of the surgery were reviewed and she wished to proceed.  Surgical consent had been signed.  She was given IV clindamycin as preoperative antibiotic prophylaxis.  She was transferred to the operating room and placed on the operating room table in supine position with the Right upper extremity on an armboard.  General was induced by Anesthesiology.  Right upper extremity was prepped and draped in normal sterile orthopaedic fashion.  A surgical pause was performed between the surgeons, anesthesia, and operating room  staff, and all were in agreement as to the patient, procedure, and site of procedure.  Tourniquet at the proximal aspect of the arm was inflated after exsanguination of the limb with an Esmarch bandage.   Incision was made over the transverse carpal ligament and carried into the subcutaneous tissues by spreading technique.  Bipolar electrocautery was used to obtain hemostasis.  The palmar fascia was sharply incised.  The transverse carpal ligament was identified and sharply incised.  It was incised distally first.  Care was taken to ensure complete decompression distally.  It was then incised proximally.  Scissors were used to split the distal aspect of the volar antebrachial fascia.  A finger was placed into the wound to ensure complete decompression, which was the case.  The nerve was examined.  It was adherent to the radial leaflet.  The motor branch was identified and was intact.  The wound was copiously irrigated with sterile saline.  It was then closed with 4-0 nylon in a horizontal mattress fashion.  It was injected with 0.25% plain Marcaine to aid in postoperative analgesia.  It was dressed with sterile Xeroform, 4x4s, an ABD, and wrapped with Kerlix and an Ace bandage.  Tourniquet was deflated at 14 minutes.  Fingertips were pink with brisk capillary refill after deflation of the tourniquet.  Operative drapes were broken down.  The patient was awoken from anesthesia safely.  She was transferred back to stretcher and taken to the PACU in stable condition.  I will see her back in  the office in 1 week for postoperative followup.  I will give her a prescription for norco 5/325 1-2 tabs PO q6 hours prn pain, dispense #20.    Tami RibasKUZMA,Darshan Solanki R, MD Electronically signed, 08/31/17

## 2017-08-31 NOTE — Brief Op Note (Signed)
08/31/2017  3:02 PM  PATIENT:  Dawn JarvisJanet [place]  51 y.o. female  PRE-OPERATIVE DIAGNOSIS:  RIGHT CARPAL TUNNEL  POST-OPERATIVE DIAGNOSIS:  RIGHT CARPAL TUNNEL  PROCEDURE:  Procedure(s): RIGHT CARPAL TUNNEL RELEASE (Right)  SURGEON:  Surgeon(s) and Role:    * Betha LoaKuzma, Derrica Sieg, MD - Primary  PHYSICIAN ASSISTANT:   ASSISTANTS: none   ANESTHESIA:   general  EBL:  minimal   BLOOD ADMINISTERED:none  DRAINS: none   LOCAL MEDICATIONS USED:  MARCAINE     SPECIMEN:  No Specimen  DISPOSITION OF SPECIMEN:  N/A  COUNTS:  YES  TOURNIQUET:   Total Tourniquet Time Documented: Upper Arm (Right) - 14 minutes Total: Upper Arm (Right) - 14 minutes   DICTATION: .Note written in EPIC  PLAN OF CARE: Discharge to home after PACU  PATIENT DISPOSITION:  PACU - hemodynamically stable.

## 2017-08-31 NOTE — Addendum Note (Signed)
Addendum  created 08/31/17 1547 by Ronnette HilaPayne, Fard Borunda D, CRNA   Intraprocedure Flowsheets edited

## 2017-09-01 ENCOUNTER — Encounter (HOSPITAL_BASED_OUTPATIENT_CLINIC_OR_DEPARTMENT_OTHER): Payer: Self-pay | Admitting: Orthopedic Surgery

## 2017-09-01 NOTE — Addendum Note (Signed)
Addendum  created 09/01/17 1159 by Tiffannie Sloss, Jewel Baizeimothy D, CRNA   Charge Capture section accepted

## 2017-09-16 ENCOUNTER — Encounter: Payer: Self-pay | Admitting: Cardiovascular Disease

## 2017-09-16 ENCOUNTER — Ambulatory Visit (INDEPENDENT_AMBULATORY_CARE_PROVIDER_SITE_OTHER): Payer: BLUE CROSS/BLUE SHIELD | Admitting: Cardiovascular Disease

## 2017-09-16 VITALS — BP 132/90 | HR 73 | Ht 68.0 in | Wt 162.4 lb

## 2017-09-16 DIAGNOSIS — E7800 Pure hypercholesterolemia, unspecified: Secondary | ICD-10-CM | POA: Diagnosis not present

## 2017-09-16 DIAGNOSIS — I2510 Atherosclerotic heart disease of native coronary artery without angina pectoris: Secondary | ICD-10-CM | POA: Diagnosis not present

## 2017-09-16 NOTE — Progress Notes (Signed)
Cardiology Office Note Date:  09/16/2017   ID:  Dawn Washington, DOB 11-25-1965, MRN [id number]  PCP:  Marva Panda, NP  Cardiologist:  [name], MD    Chief Complaint  Patient presents with  . Shortness of Breath     History of Present Illness: Dawn Washington is a 51 y.o. female who presents for follow-up of coronary artery disease.  She has CAD and underwent PCI of the proximal LAD in 2011 and PCI of the distal RCA in 2012.  Repeat cardiac catheterization when she presented again with chest pain demonstrated patency at her stent sites.  Her last stress echocardiogram showed normal LV function and no ischemic changes.  The patient has had a tough year.  Her mother died suddenly 1 year ago and she has been having to care for her grandmother.  She does complain of some shortness of breath with pushing her grandmother in a wheelchair.  She denies any dyspnea with normal activities.  No chest pain or chest pressure.  She has palpitations which are chronic and unchanged.  No edema, orthopnea, or PND.  The patient had labs done by her primary physician earlier this year demonstrating a cholesterol of 340, LDL 227, triglycerides 150, and HDL 83.  She tells me at the time the labs were drawn she had been off of her statin medication and was not eating a good diet.  She would like to have repeat labs done today as she has been back on Crestor 10 mg daily on a regular basis.  In reviewing her previous labs her lipids have actually been quite well controlled on low-dose Crestor.  She has not been able to tolerate higher doses because of myalgias.  Past Medical History:  Diagnosis Date  . CAD (coronary artery disease)    NSTEMI in 2011 treated with DES to the LAD;    Cardiac cath 7/20: Mid RCA ectatic, proximal and mid RCA 30-40%, distal RCA 80-90%, left main 20-30%, proximal LAD stent patent with 30-40% after the stent, D1 ostial 50%, pCFX 50%;  Status post DES to the RCA 05/01/11  .  Diverticulitis   . Dyslipidemia   . Gastroesophageal reflux disease   . Myocardial infarction (HCC)   . Numbness and tingling in right hand     Past Surgical History:  Procedure Laterality Date  . CARDIAC CATHETERIZATION  05/28/2011;2011;2012  . CARPAL TUNNEL RELEASE Right 08/31/2017   Procedure: RIGHT CARPAL TUNNEL RELEASE;  Surgeon: Betha Loa, MD;  Location: [facility];  Service: Orthopedics;  Laterality: Right;  . CHOLECYSTECTOMY  1999  . MOLE REMOVAL Bilateral    2 moles removed from neck and 1 removed from her arm   . RECTOCELE REPAIR N/A 08/08/2013   Procedure: POSTERIOR REPAIR (RECTOCELE);  Surgeon: Tilda Burrow, MD;  Location: [facility];  Service: Gynecology;  Laterality: N/A;  . Stents  2011;2012  . TUBAL LIGATION  1999  . Wisdom Teeth Extractiion Bilateral 1986    Current Outpatient Medications  Medication Sig Dispense Refill  . aspirin 81 MG tablet Take 81 mg by mouth daily.    Marland Kitchen CALCIUM-MAGNESIUM-ZINC PO Take 1 tablet by mouth daily.    . Cholecalciferol (VITAMIN D) 2000 units CAPS Take 1 capsule by mouth daily.    . Multiple Vitamin (MULTIVITAMIN) capsule Take 1 capsule by mouth daily.    . nitroGLYCERIN (NITROSTAT) 0.4 MG SL tablet Place 1 tablet (0.4 mg total) under the tongue every 5 (five) minutes as  needed for chest pain. 25 tablet 2  . Omega-3 Fatty Acids (FISH OIL) 1200 MG CAPS Take 1,200 mg by mouth.    . Probiotic Product (PROBIOTIC ADVANCED PO) Take 1 capsule by mouth daily.    . rosuvastatin (CRESTOR) 10 MG tablet Take 1 tablet (10 mg total) by mouth daily. Please keep upcoming appt with Dr. Excell Seltzerooper for future refills. Thanks 30 tablet 1   No current facility-administered medications for this visit.     Allergies:   Adhesive [tape]; Latex; Sulfonamide derivatives; and Penicillins   Social History:  The patient  reports that she quit smoking about 15 years ago. Her smoking use included cigarettes. She has a 30.00 pack-year smoking history.  she has never used smokeless tobacco. She reports that she does not drink alcohol or use drugs.   Family History:  The patient's  family history includes Coronary artery disease in her father; Diabetes in her mother and sister; Heart disease in her father; Stroke in her maternal grandmother.    ROS:  Please see the history of present illness.  Otherwise, review of systems is positive for palpitations.  All other systems are reviewed and negative.    PHYSICAL EXAM: VS:  BP 132/90   Pulse 73   Ht 5\' 8"  (1.727 m)   Wt 162 lb 6.4 oz (73.7 kg)   LMP 11/26/2015   SpO2 97%   BMI 24.69 kg/m  , BMI Body mass index is 24.69 kg/m. GEN: Well nourished, well developed, in no acute distress  HEENT: normal  Neck: no JVD, no masses. No carotid bruits Cardiac: RRR without murmur or gallop                Respiratory:  clear to auscultation bilaterally, normal work of breathing GI: soft, nontender, nondistended, + BS MS: no deformity or atrophy  Ext: no pretibial edema, pedal pulses 2+= bilaterally Skin: warm and dry, no rash Neuro:  Strength and sensation are intact Psych: euthymic mood, full affect  EKG:  EKG is ordered today. The ekg ordered today shows normal sinus rhythm 73 bpm, within normal limits.  Recent Labs: No results found for requested labs within last 8760 hours.   Lipid Panel     Component Value Date/Time   CHOL 167 07/22/2015 1116   TRIG 71 07/22/2015 1116   HDL 74 07/22/2015 1116   CHOLHDL 2.3 07/22/2015 1116   VLDL 14 07/22/2015 1116   LDLCALC 79 07/22/2015 1116   LDLDIRECT 159.4 05/02/2013 1136      Wt Readings from Last 3 Encounters:  09/16/17 162 lb 6.4 oz (73.7 kg)  08/31/17 161 lb 4 oz (73.1 kg)  06/22/17 163 lb 8 oz (74.2 kg)    Stress Echo 05-18-2014: Study Conclusions  - Stress ECG conclusions: There were no stress arrhythmias or conduction abnormalities. The stress ECG was negative for ischemia. - Staged echo: There was no echocardiographic  evidence for stress-induced ischemia. - Impressions: Normal resting and stress images PVC;s with exertion  Impressions:  - Normal resting and stress images PVC;s with exertion  ASSESSMENT AND PLAN: 1.  CAD, native vessel, without angina: She will continue on aspirin and a statin drug.  I would like to see her back in 1 year.  2.  Hyperlipidemia: Lengthy discussion about her lipids today.  We will check a lipid panel now that she is on her statin drug as prescribed.  We did discuss PCS canine inhibitors.  We will see what her labs look like and  then make a decision about whether she needs any additional pharmacotherapy.  3.  Shortness of breath: She notices this only when she is pushing her grandmother in a wheelchair.  She is otherwise fairly active without limitation.  She requests a handicap tag for her car so that she can help manage her grandmother situation a little easier.  I do not think there is anything related to progressive coronary disease or congestive heart failure causing her to be short of breath.  Her exam is benign and her last stress echo in 2015 showed no significant problems.  Current medicines are reviewed with the patient today.  The patient does not have concerns regarding medicines.  Labs/ tests ordered today include:   Orders Placed This Encounter  Procedures  . Hepatic function panel  . Lipid panel  . EKG 12-Lead    Disposition:   FU one year  Signed, Tonny BollmanMichael Marinda Tyer, MD  09/16/2017 5:50 PM    [facility] [address], [place], [place]  [address] Phone: [phone]; Fax: [phone]

## 2017-09-16 NOTE — Patient Instructions (Signed)
Medication Instructions:  Your provider recommends that you continue on your current medications as directed. Please refer to the Current Medication list given to you today.    Labwork: TODAY: Lipids and liver  Testing/Procedures: None  Follow-Up: Your provider wants you to follow-up in: 1 year with Dr. Excell Seltzerooper. You will receive a reminder letter in the mail two months in advance. If you don't receive a letter, please call our office to schedule the follow-up appointment.    Any Other Special Instructions Will Be Listed Below (If Applicable).     If you need a refill on your cardiac medications before your next appointment, please call your pharmacy.

## 2017-09-17 LAB — HEPATIC FUNCTION PANEL
ALT: 25 IU/L (ref 0–32)
AST: 29 IU/L (ref 0–40)
Albumin: 4.7 g/dL (ref 3.5–5.5)
Alkaline Phosphatase: 82 IU/L (ref 39–117)
Bilirubin Total: 0.2 mg/dL (ref 0.0–1.2)
Bilirubin, Direct: 0.08 mg/dL (ref 0.00–0.40)
Total Protein: 7.3 g/dL (ref 6.0–8.5)

## 2017-09-17 LAB — LIPID PANEL
CHOL/HDL RATIO: 2.5 ratio (ref 0.0–4.4)
Cholesterol, Total: 216 mg/dL — ABNORMAL HIGH (ref 100–199)
HDL: 85 mg/dL (ref >39)
LDL Calculated: 110 mg/dL — ABNORMAL HIGH (ref 0–99)
TRIGLYCERIDES: 103 mg/dL (ref 0–149)
VLDL CHOLESTEROL CAL: 21 mg/dL (ref 5–40)

## 2017-10-04 ENCOUNTER — Other Ambulatory Visit: Payer: Self-pay | Admitting: Cardiovascular Disease

## 2017-11-11 ENCOUNTER — Telehealth: Payer: Self-pay | Admitting: Cardiovascular Disease

## 2017-11-11 NOTE — Telephone Encounter (Signed)
New message       [facility] Pre-operative Risk Assessment    Request for surgical clearance:  1. What type of surgery is being performed? Anterior cervical discectomy fusion  2. When is this surgery scheduled? TBD  3. What type of clearance is required (medical clearance vs. Pharmacy clearance to hold med vs. Both)? both  4. Are there any medications that need to be held prior to surgery and how long?n/a  Practice name and name of physician performing surgery? Dr Brigitte Pulse  5. What is your office phone and fax number? Fax [phone]  6. Anesthesia type (None, local, MAC, general) ?general   Laurier Nancy 11/11/2017, 9:24 AM  _________________________________________________________________   (provider comments below)

## 2017-11-12 NOTE — Telephone Encounter (Signed)
   Primary Cardiologist: Tonny BollmanMichael Cooper, MD  Chart reviewed as part of pre-operative protocol coverage. Patient was contacted 11/12/2017 in reference to pre-operative risk assessment for pending surgery as outlined below.  Markus JarvisJanet [place]ers was last seen on 09/16/17 by Dr. Excell Seltzerooper.  Since that day, Markus JarvisJanet East Metayer has done well. She is able to achieve at least 4 Mets of activity without any issue.   Therefore, based on ACC/AHA guidelines, the patient would be at acceptable risk for the planned procedure without further cardiovascular testing.   I will route this recommendation to Dr. Excell Seltzerooper to review ASA. OK to held and how long?   VenangoBhavinkumar Laketra Bowdish, [place] 11/12/2017, 1:39 PM

## 2017-11-12 NOTE — Telephone Encounter (Signed)
Ok to hold ASA x 7 days if necessary. thx

## 2018-02-04 ENCOUNTER — Other Ambulatory Visit: Payer: BLUE CROSS/BLUE SHIELD | Admitting: Obstetrics and Gynecology

## 2018-06-24 ENCOUNTER — Other Ambulatory Visit: Payer: Self-pay | Admitting: Obstetrics and Gynecology

## 2018-06-24 DIAGNOSIS — Z1231 Encounter for screening mammogram for malignant neoplasm of breast: Secondary | ICD-10-CM

## 2018-07-28 ENCOUNTER — Ambulatory Visit
Admission: RE | Admit: 2018-07-28 | Discharge: 2018-07-28 | Disposition: A | Discharge status: Home / Self Care | Payer: BLUE CROSS/BLUE SHIELD | Source: Ambulatory Visit | Attending: Obstetrics and Gynecology | Admitting: Obstetrics and Gynecology

## 2018-07-28 DIAGNOSIS — Z1231 Encounter for screening mammogram for malignant neoplasm of breast: Secondary | ICD-10-CM

## 2018-08-24 ENCOUNTER — Other Ambulatory Visit: Payer: Self-pay | Admitting: Cardiovascular Disease

## 2019-02-03 ENCOUNTER — Telehealth: Payer: Self-pay | Admitting: Cardiovascular Disease

## 2019-02-03 NOTE — Telephone Encounter (Signed)
Pt called requesting a follow up appt with Dr. Excell Seltzer..  Last OV 09/16/17  for CAD ...she was due for follow up 09/2018... sh prefers to wait until Aug to see him physically.  Asked the pt how she is doing and she denies CP, SOB, palpitations but feels her heart "dial like phone"... not pain just an odd feeling.. only on occasion and not related to any specific activity and no symptoms associated with it.   Advised pt I am unsure of Dr. Earmon Phoenix availability for Aug.. his nurse Orpha Bur may have a waiting list but I will forward her request to her but she is out of the office this afternoon.. pt is appreciative and reports she does not want to see an APP as per a previous conversation she had with Dr. Excell Seltzer.

## 2019-02-03 NOTE — Telephone Encounter (Signed)
New Message    Pt is calling to set up an office visit for August. She said she doesn't want to see a PA and wants an appt with only Dr Dawn Washington    Please call

## 2019-02-20 NOTE — Telephone Encounter (Signed)
See MyChart messages. Scheduled patient 8/31.

## 2019-06-12 ENCOUNTER — Ambulatory Visit: Payer: Managed Care, Other (non HMO) | Admitting: Cardiovascular Disease

## 2019-06-12 ENCOUNTER — Other Ambulatory Visit: Payer: Self-pay

## 2019-06-12 ENCOUNTER — Encounter: Payer: Self-pay | Admitting: Cardiovascular Disease

## 2019-06-12 VITALS — BP 112/84 | HR 75 | Ht 68.0 in | Wt 166.2 lb

## 2019-06-12 DIAGNOSIS — E782 Mixed hyperlipidemia: Secondary | ICD-10-CM

## 2019-06-12 DIAGNOSIS — R002 Palpitations: Secondary | ICD-10-CM

## 2019-06-12 DIAGNOSIS — I25118 Atherosclerotic heart disease of native coronary artery with other forms of angina pectoris: Secondary | ICD-10-CM | POA: Diagnosis not present

## 2019-06-12 NOTE — Progress Notes (Signed)
Cardiology Office Note:    Date:  06/12/2019   ID:  Dawn ShearerJanet East Val[place]iano, DOB 02/16/1966, MRN [id number]  PCP:  Marva PandaMillsaps, Kimberly, NP  Cardiologist:  Tonny BollmanMichael Laini Urick, MD  Electrophysiologist:  None   Referring MD: Marva PandaMillsaps, Kimberly, NP   Chief Complaint  Patient presents with  . Shortness of Breath    History of Present Illness:    Dawn Washington is a 53 y.o. female with a hx of CAD, presenting for follow-up evaluation today. She has CAD and underwent PCI of the proximal LAD in 2011 and PCI of the distal RCA in 2012.  Repeat cardiac catheterization when she presented again with chest pain demonstrated patency at her stent sites.  Her last stress echocardiogram showed normal LV function and no ischemic changes.  The patient is here alone today. She reports stable symptoms of mild exertional dyspnea with physical activity. She reports chest discomfort with anxiety and when 'walking really fast.' otherwise she feels well and has no symptoms with normal activities. Denies orthopnea, PND, or edema. She does have heart palpitations and these sometimes wake her up at night.   Past Medical History:  Diagnosis Date  . CAD (coronary artery disease)    NSTEMI in 2011 treated with DES to the LAD;    Cardiac cath 7/20: Mid RCA ectatic, proximal and mid RCA 30-40%, distal RCA 80-90%, left main 20-30%, proximal LAD stent patent with 30-40% after the stent, D1 ostial 50%, pCFX 50%;  Status post DES to the RCA 05/01/11  . Diverticulitis   . Dyslipidemia   . Gastroesophageal reflux disease   . Myocardial infarction (HCC)   . Numbness and tingling in right hand     Past Surgical History:  Procedure Laterality Date  . CARDIAC CATHETERIZATION  05/28/2011;2011;2012  . CARPAL TUNNEL RELEASE Right 08/31/2017   Procedure: RIGHT CARPAL TUNNEL RELEASE;  Surgeon: Betha LoaKuzma, Kevin, MD;  Location: [facility];  Service: Orthopedics;  Laterality: Right;  . CHOLECYSTECTOMY  1999  . MOLE REMOVAL  Bilateral    2 moles removed from neck and 1 removed from her arm   . RECTOCELE REPAIR N/A 08/08/2013   Procedure: POSTERIOR REPAIR (RECTOCELE);  Surgeon: Tilda BurrowJohn V Ferguson, MD;  Location: [facility];  Service: Gynecology;  Laterality: N/A;  . Stents  2011;2012  . TUBAL LIGATION  1999  . Wisdom Teeth Extractiion Bilateral 1986    Current Medications: Current Meds  Medication Sig  . Ascorbic Acid (VITAMIN C) 1000 MG tablet Take 1 tablet by mouth daily.  Marland Kitchen. aspirin 81 MG tablet Take 81 mg by mouth daily.  Marland Kitchen. CALCIUM-MAGNESIUM-ZINC PO Take 1 tablet by mouth daily.  . Cholecalciferol (VITAMIN D) 2000 units CAPS Take 1 capsule by mouth daily.  . Coenzyme Q10 (COQ10) 100 MG CAPS Take 1 capsule by mouth daily.  . Cyanocobalamin (B-12) 1000 MCG CAPS Take 1 capsule by mouth daily.  . Menaquinone-7 (VITAMIN K2) 100 MCG CAPS Take 100 mcg by mouth daily.  . Multiple Vitamin (MULTIVITAMIN) capsule Take 1 capsule by mouth daily.  . Multiple Vitamins-Minerals (HAIR SKIN & NAILS ADVANCED) TABS Take 1 tablet by mouth daily.  . nitroGLYCERIN (NITROSTAT) 0.4 MG SL tablet Place 1 tablet (0.4 mg total) under the tongue every 5 (five) minutes as needed for chest pain.  . Omega-3 Fatty Acids (FISH OIL) 1200 MG CAPS Take 1,200 mg by mouth.  . Probiotic Product (PROBIOTIC ADVANCED PO) Take 1 capsule by mouth daily.  . TURMERIC PO Take 1 tablet by  mouth daily.     Allergies:   Adhesive [tape], Latex, Sulfonamide derivatives, Nsaids, and Penicillins   Social History   Socioeconomic History  . Marital status: Married    Spouse name: Not on file  . Number of children: 2  . Years of education: 21  . Highest education level: Not on file  Occupational History  . Occupation: Futures trader  Social Needs  . Financial resource strain: Not on file  . Food insecurity    Worry: Not on file    Inability: Not on file  . Transportation needs    Medical: Not on file    Non-medical: Not on file  Tobacco Use  . Smoking  status: Former Smoker    Packs/day: 1.25    Years: 24.00    Pack years: 30.00    Types: Cigarettes    Quit date: 10/12/2001    Years since quitting: 17.6  . Smokeless tobacco: Never Used  Substance and Sexual Activity  . Alcohol use: No  . Drug use: No  . Sexual activity: Not Currently    Birth control/protection: Surgical    Comment: BTL  Lifestyle  . Physical activity    Days per week: Not on file    Minutes per session: Not on file  . Stress: Not on file  Relationships  . Social Musician on phone: Not on file    Gets together: Not on file    Attends religious service: Not on file    Active member of club or organization: Not on file    Attends meetings of clubs or organizations: Not on file    Relationship status: Not on file  Other Topics Concern  . 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   3 cups caffeine daily.     Family History: The patient's family history includes Coronary artery disease in her father; Diabetes in her mother and sister; Heart disease in her father; Stroke in her maternal grandmother. There is no history of Breast cancer.  ROS:   Please see the history of present illness.    All other systems reviewed and are negative.  EKGs/Labs/Other Studies Reviewed:    EKG:  EKG is ordered today.  The ekg ordered today demonstrates NSR 75 bpm, nonspecific ST abnormality  Recent Labs: No results found for requested labs within last 8760 hours.  Recent Lipid Panel    Component Value Date/Time   CHOL 216 (H) 09/16/2017 1226   TRIG 103 09/16/2017 1226   HDL 85 09/16/2017 1226   CHOLHDL 2.5 09/16/2017 1226   CHOLHDL 2.3 07/22/2015 1116   VLDL 14 07/22/2015 1116   LDLCALC 110 (H) 09/16/2017 1226   LDLDIRECT 159.4 05/02/2013 1136    Physical Exam:    VS:  BP 112/84   Pulse 75   Ht 5\' 8"  (1.727 m)   Wt 166 lb 3.2 oz (75.4 kg)   LMP 11/26/2015    SpO2 97%   BMI 25.27 kg/m     Wt Readings from Last 3 Encounters:  06/12/19 166 lb 3.2 oz (75.4 kg)  09/16/17 162 lb 6.4 oz (73.7 kg)  08/31/17 161 lb 4 oz (73.1 kg)     GEN:  Well nourished, well developed in no acute distress HEENT: Normal NECK: No JVD; No carotid bruits LYMPHATICS: No lymphadenopathy CARDIAC: RRR, no murmurs, rubs, gallops RESPIRATORY:  Clear to auscultation without  rales, wheezing or rhonchi  ABDOMEN: Soft, non-tender, non-distended MUSCULOSKELETAL:  No edema; No deformity  SKIN: Warm and dry NEUROLOGIC:  Alert and oriented x 3 PSYCHIATRIC:  Normal affect   ASSESSMENT:    1. Coronary artery disease involving native coronary artery of native heart with other form of angina pectoris ([place])   2. Mixed hyperlipidemia   3. Palpitations    PLAN:    In order of problems listed above:  1. The patient appears to have mild stable angina. Her last stress test was 5 years ago. At that time she had a stress echocardiogram. She is only on ASA 81 mg and she really wants to avoid any other prescription medicine. Will check an exercise stress Myoview when she is able to come if without Covid-19 testing and quarantine. We will put her on a list to call when the protocol changes. 2. She is not interesting in lipid clinic referral for PCSK9. I reviewed this again with her today. She's intolerant of statin drugs.  3. 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 I offered her a beta-blocker but she declined. She doesn't drink caffeine.   Medication Adjustments/Labs and Tests Ordered: Current medicines are reviewed at length with the patient today.  Concerns regarding medicines are outlined above.  Orders Placed This Encounter  Procedures  . MYOCARDIAL PERFUSION IMAGING  . EKG 12-Lead   No orders of the defined types were placed in this encounter.   Patient Instructions  Medication Instructions:  Your provider recommends that you continue on  your current medications as directed. Please refer to the Current Medication list given to you today.    Labwork: None  Testing/Procedures: Dr. Burt Knack recommends you have a NUCLEAR STRESS TEST. You will be notified when we are no longer requiring COVID testing.  Follow-Up: Your provider wants you to follow-up in: 1 year with Dr. Burt Knack. You will receive a reminder letter in the mail two months in advance. If you don't receive a letter, please call our office to schedule the follow-up appointment.       Signed, Sherren Mocha, MD  06/12/2019 2:16 PM    [place]

## 2019-06-12 NOTE — Patient Instructions (Signed)
Medication Instructions:  Your provider recommends that you continue on your current medications as directed. Please refer to the Current Medication list given to you today.    Labwork: None  Testing/Procedures: Dr. Burt Knack recommends you have a NUCLEAR STRESS TEST. You will be notified when we are no longer requiring COVID testing.  Follow-Up: Your provider wants you to follow-up in: 1 year with Dr. Burt Knack. You will receive a reminder letter in the mail two months in advance. If you don't receive a letter, please call our office to schedule the follow-up appointment.

## 2019-07-19 ENCOUNTER — Other Ambulatory Visit: Payer: Self-pay | Admitting: Obstetrics and Gynecology

## 2019-07-19 DIAGNOSIS — Z1231 Encounter for screening mammogram for malignant neoplasm of breast: Secondary | ICD-10-CM

## 2019-08-25 ENCOUNTER — Ambulatory Visit
Admission: RE | Admit: 2019-08-25 | Discharge: 2019-08-25 | Disposition: A | Discharge status: Home / Self Care | Payer: Managed Care, Other (non HMO) | Source: Ambulatory Visit | Attending: Obstetrics and Gynecology | Admitting: Obstetrics and Gynecology

## 2019-08-25 ENCOUNTER — Other Ambulatory Visit: Payer: Self-pay

## 2019-08-25 DIAGNOSIS — Z1231 Encounter for screening mammogram for malignant neoplasm of breast: Secondary | ICD-10-CM

## 2019-09-15 ENCOUNTER — Other Ambulatory Visit: Payer: BLUE CROSS/BLUE SHIELD | Admitting: Obstetrics and Gynecology

## 2019-10-25 ENCOUNTER — Other Ambulatory Visit: Payer: Self-pay

## 2019-10-25 ENCOUNTER — Encounter: Payer: Self-pay | Admitting: Obstetrics and Gynecology

## 2019-10-25 ENCOUNTER — Other Ambulatory Visit (HOSPITAL_COMMUNITY)
Admission: RE | Admit: 2019-10-25 | Discharge: 2019-10-25 | Disposition: A | Discharge status: Home / Self Care | Payer: Managed Care, Other (non HMO) | Source: Ambulatory Visit | Attending: Obstetrics and Gynecology | Admitting: Obstetrics and Gynecology

## 2019-10-25 ENCOUNTER — Ambulatory Visit (INDEPENDENT_AMBULATORY_CARE_PROVIDER_SITE_OTHER): Payer: Managed Care, Other (non HMO) | Admitting: Obstetrics and Gynecology

## 2019-10-25 DIAGNOSIS — Z01419 Encounter for gynecological examination (general) (routine) without abnormal findings: Secondary | ICD-10-CM | POA: Insufficient documentation

## 2019-10-25 NOTE — Progress Notes (Signed)
Patient ID: Dawn Washington, female   DOB: [birth year], 54 y.o.   MRN: [id number]  Assessment:  1. Annual Gyn Exam 2. Recurrent small Rectocele s/p rectocele surgery several years ago Plan:  1. Pap smear done, next pap due 5 years 2. Return annually or prn 3    Annual mammogram advised after age 10 Subjective:  Dawn Washington is a 54 y.o. female No obstetric history on file. who presents for annual exam. Patient's last menstrual period was 11/26/2015. The patient has complaints today of a rectocele. She has previously had surgery for the rectocele in 2016.  She manages defecation by splinting at introitus when she strains and this works.She recently had an angioma removed from the lateral side of her right breast. She had night sweats in the past, but they have subsided. She is not interested in HT.   The following portions of the patient's history were reviewed and updated as appropriate: allergies, current medications, past family history, past medical history, past social history, past surgical history and problem list. Past Medical History:  Diagnosis Date  . CAD (coronary artery disease)    NSTEMI in 2011 treated with DES to the LAD;    Cardiac cath 7/20: Mid RCA ectatic, proximal and mid RCA 30-40%, distal RCA 80-90%, left main 20-30%, proximal LAD stent patent with 30-40% after the stent, D1 ostial 50%, pCFX 50%;  Status post DES to the RCA 05/01/11  . Diverticulitis   . Dyslipidemia   . Gastroesophageal reflux disease   . Myocardial infarction ([place])   . Numbness and tingling in right hand     Past Surgical History:  Procedure Laterality Date  . CARDIAC CATHETERIZATION  05/28/2011;2011;2012  . CARPAL TUNNEL RELEASE Right 08/31/2017   Procedure: RIGHT CARPAL TUNNEL RELEASE;  Surgeon: Leanora Cover, MD;  Location: [facility];  Service: Orthopedics;  Laterality: Right;  . CERVICAL DISCECTOMY    . CHOLECYSTECTOMY  1999  . MOLE REMOVAL Bilateral    2 moles removed from  neck and 1 removed from her arm   . RECTOCELE REPAIR N/A 08/08/2013   Procedure: POSTERIOR REPAIR (RECTOCELE);  Surgeon: Jonnie Kind, MD;  Location: [facility];  Service: Gynecology;  Laterality: N/A;  . Stents  2011;2012  . TUBAL LIGATION  1999  . Wisdom Teeth Extractiion Bilateral 1986     Current Outpatient Medications:  .  Ascorbic Acid (VITAMIN C) 1000 MG tablet, Take 1 tablet by mouth daily., Disp: , Rfl:  .  aspirin 81 MG tablet, Take 81 mg by mouth daily., Disp: , Rfl:  .  CALCIUM-MAGNESIUM-ZINC PO, Take 1 tablet by mouth daily., Disp: , Rfl:  .  Cholecalciferol (VITAMIN D) 2000 units CAPS, Take 1 capsule by mouth daily., Disp: , Rfl:  .  Coenzyme Q10 (COQ10) 100 MG CAPS, Take 1 capsule by mouth daily., Disp: , Rfl:  .  Cyanocobalamin (B-12) 1000 MCG CAPS, Take 1 capsule by mouth daily., Disp: , Rfl:  .  Menaquinone-7 (VITAMIN K2) 100 MCG CAPS, Take 100 mcg by mouth daily., Disp: , Rfl:  .  Multiple Vitamin (MULTIVITAMIN) capsule, Take 1 capsule by mouth daily., Disp: , Rfl:  .  Multiple Vitamins-Minerals (HAIR SKIN & NAILS ADVANCED) TABS, Take 1 tablet by mouth daily., Disp: , Rfl:  .  Omega-3 Fatty Acids (FISH OIL) 1200 MG CAPS, Take 1,200 mg by mouth., Disp: , Rfl:  .  Probiotic Product (PROBIOTIC ADVANCED PO), Take 1 capsule by mouth daily., Disp: , Rfl:  .  TURMERIC PO, Take 1 tablet by mouth daily., Disp: , Rfl:  .  nitroGLYCERIN (NITROSTAT) 0.4 MG SL tablet, Place 1 tablet (0.4 mg total) under the tongue every 5 (five) minutes as needed for chest pain. (Patient not taking: Reported on 10/25/2019), Disp: 25 tablet, Rfl: 2  Review of Systems Constitutional: negative Gastrointestinal: Rectocele Genitourinary: negative  Objective:  LMP 11/26/2015    BMI: There is no height or weight on file to calculate BMI.  General Appearance: Alert, appropriate appearance for age. No acute distress HEENT: Grossly normal Neck / Thyroid:  Cardiovascular: RRR; normal S1, S2, no  murmur Lungs: CTA bilaterally Back: No CVAT Breast Exam: No dimpling, nipple retraction or discharge. No masses or nodes., Normal to inspection, Normal breast tissue bilaterally and No masses or nodes.No dimpling, nipple retraction or discharge. Dense tissue. Gastrointestinal: Soft, non-tender, no masses or organomegaly Pelvic Exam: Vulva and vagina appear normal. Bimanual exam reveals normal uterus and adnexa. Rectovaginal: Small anterior rectocele, 1.5 cm defect of tissue between two stitches from previous rectocele procedure; hemoccult negative  Introitus support is good. Lymphatic Exam: Non-palpable nodes in neck, clavicular, axillary, or inguinal regions  Skin: no rash or abnormalities Neurologic: Normal gait and speech, no tremor  Psychiatric: Alert and oriented, appropriate affect.  Urinalysis:Not done  Christin Bach. MD Pgr [phone] 4:12 PM  By signing my name below, I, Pietro Cassis, attest that this documentation has been prepared under the direction and in the presence of Tilda Burrow, MD. Electronically Signed: Pietro Cassis, Medical Scribe. 10/25/19. 4:12 PM.  I personally performed the services described in this documentation, which was SCRIBED in my presence. The recorded information has been reviewed and considered accurate. It has been edited as necessary during review. Tilda Burrow, MD

## 2019-10-27 LAB — CYTOLOGY - PAP
Comment: NEGATIVE
Diagnosis: NEGATIVE
High risk HPV: NEGATIVE

## 2020-05-29 ENCOUNTER — Telehealth (HOSPITAL_COMMUNITY): Payer: Self-pay | Admitting: *Deleted

## 2020-05-29 NOTE — Telephone Encounter (Signed)
Patient given detailed instructions per Myocardial Perfusion Study Information Sheet for the test on 06/03/20 at 8:00. Patient notified to arrive 15 minutes early and that it is imperative to arrive on time for appointment to keep from having the test rescheduled.  If you need to cancel or reschedule your appointment, please call the office within 24 hours of your appointment. . Patient verbalized understanding.Dawn Washington

## 2020-06-03 ENCOUNTER — Ambulatory Visit (HOSPITAL_COMMUNITY): Payer: Managed Care, Other (non HMO) | Attending: Cardiology

## 2020-06-03 ENCOUNTER — Other Ambulatory Visit: Payer: Self-pay

## 2020-06-03 DIAGNOSIS — I25118 Atherosclerotic heart disease of native coronary artery with other forms of angina pectoris: Secondary | ICD-10-CM | POA: Insufficient documentation

## 2020-06-03 LAB — MYOCARDIAL PERFUSION IMAGING
Estimated workload: 9 METS
Exercise duration (min): 7 min
Exercise duration (sec): 20 s
LV dias vol: 53 mL (ref 46–106)
LV sys vol: 15 mL
MPHR: 166 {beats}/min
Peak HR: 153 {beats}/min
Percent HR: 92 %
Rest HR: 76 {beats}/min
SDS: 0
SRS: 0
SSS: 0
TID: 0.84

## 2020-06-03 MED ORDER — TECHNETIUM TC 99M TETROFOSMIN IV KIT
10.1000 | PACK | Freq: Once | INTRAVENOUS | Status: AC | PRN
  Administered 2020-06-03: 10.1 via INTRAVENOUS
  Filled 2020-06-03: qty 11

## 2020-06-03 MED ORDER — TECHNETIUM TC 99M TETROFOSMIN IV KIT
32.8000 | PACK | Freq: Once | INTRAVENOUS | Status: AC | PRN
  Administered 2020-06-03: 32.8 via INTRAVENOUS
  Filled 2020-06-03: qty 33

## 2020-06-05 ENCOUNTER — Other Ambulatory Visit: Payer: Self-pay

## 2020-06-05 ENCOUNTER — Ambulatory Visit: Payer: Managed Care, Other (non HMO) | Admitting: Cardiovascular Disease

## 2020-06-05 ENCOUNTER — Encounter: Payer: Self-pay | Admitting: Cardiovascular Disease

## 2020-06-05 VITALS — BP 110/82 | HR 71 | Ht 68.0 in | Wt 169.0 lb

## 2020-06-05 DIAGNOSIS — I2510 Atherosclerotic heart disease of native coronary artery without angina pectoris: Secondary | ICD-10-CM | POA: Diagnosis not present

## 2020-06-05 DIAGNOSIS — E782 Mixed hyperlipidemia: Secondary | ICD-10-CM

## 2020-06-05 NOTE — Patient Instructions (Signed)
Medication Instructions:  Your provider recommends that you continue on your current medications as directed. Please refer to the Current Medication list given to you today.   *If you need a refill on your cardiac medications before your next appointment, please call your pharmacy*   Follow-Up: You have been referred to LIPID CLINIC.  At [facility], you and your health needs are our priority.  As part of our continuing mission to provide you with exceptional heart care, we have created designated Provider Care Teams.  These Care Teams include your primary Cardiologist (physician) and Advanced Practice Providers (APPs -  Physician Assistants and Nurse Practitioners) who all work together to provide you with the care you need, when you need it. Your next appointment:   12 month(s) The format for your next appointment:   In Person Provider:   You may see [name], MD or one of the following Advanced Practice Providers on your designated Care Team:    Tereso Newcomer, PA-C  [place], [place]

## 2020-06-05 NOTE — Progress Notes (Signed)
Cardiology Office Note:    Date:  06/05/2020   ID:  Dawn Washington, DOB 29-Nov-1965, MRN [id number]  PCP:  Dawn Panda, NP  [facility] Cardiologist:  Dawn Bollman, MD  [facility] Electrophysiologist:  None   Referring MD: Dawn Panda, NP   Chief Complaint  Patient presents with  . Coronary Artery Disease    History of Present Illness:    Dawn Washington is a 54 y.o. female with a hx of coronary artery disease, presenting for follow-up evaluation.  The patient has a history of LAD and right coronary artery stenting in 2011 and 2012, respectively.  She denies any recent anginal symptoms.  She had a recent stress Myoview that showed no ischemia.  The details of the study are copied below.  The patient has been unable to tolerate statin drugs.  She had fairly significant memory problems last time and could not function at her job.  She stopped her statin and all of her symptoms improved.  She brings in a recent lipid panel that shows a total cholesterol of 300, triglycerides 134, HDL 69, and LDL 207.  She denies chest pain, chest pressure, or shortness of breath.  She denies leg swelling, heart palpitations, orthopnea, or PND.  Past Medical History:  Diagnosis Date  . CAD (coronary artery disease)    NSTEMI in 2011 treated with DES to the LAD;    Cardiac cath 7/20: Mid RCA ectatic, proximal and mid RCA 30-40%, distal RCA 80-90%, left main 20-30%, proximal LAD stent patent with 30-40% after the stent, D1 ostial 50%, pCFX 50%;  Status post DES to the RCA 05/01/11  . Diverticulitis   . Dyslipidemia   . Gastroesophageal reflux disease   . Myocardial infarction (HCC)   . Numbness and tingling in right hand     Past Surgical History:  Procedure Laterality Date  . CARDIAC CATHETERIZATION  05/28/2011;2011;2012  . CARPAL TUNNEL RELEASE Right 08/31/2017   Procedure: RIGHT CARPAL TUNNEL RELEASE;  Surgeon: Dawn Loa, MD;  Location: [facility];  Service:  Orthopedics;  Laterality: Right;  . CERVICAL DISCECTOMY    . CHOLECYSTECTOMY  1999  . MOLE REMOVAL Bilateral    2 moles removed from neck and 1 removed from her arm   . RECTOCELE REPAIR N/A 08/08/2013   Procedure: POSTERIOR REPAIR (RECTOCELE);  Surgeon: Dawn Burrow, MD;  Location: [facility];  Service: Gynecology;  Laterality: N/A;  . Stents  2011;2012  . TUBAL LIGATION  1999  . Wisdom Teeth Extractiion Bilateral 1986    Current Medications: Current Meds  Medication Sig  . Ascorbic Acid (VITAMIN C) 1000 MG tablet Take 1 tablet by mouth daily.  Marland Kitchen aspirin 81 MG tablet Take 81 mg by mouth daily.  Marland Kitchen CALCIUM-MAGNESIUM-ZINC PO Take 1 tablet by mouth daily.  . Cholecalciferol (VITAMIN D) 2000 units CAPS Take 1 capsule by mouth daily.  . Coenzyme Q10 (COQ10) 100 MG CAPS Take 1 capsule by mouth daily.  . Cyanocobalamin (B-12) 1000 MCG CAPS Take 1 capsule by mouth daily.  . Menaquinone-7 (VITAMIN K2) 100 MCG CAPS Take 100 mcg by mouth daily.  . Multiple Vitamin (MULTIVITAMIN) capsule Take 1 capsule by mouth daily.  . Multiple Vitamins-Minerals (HAIR SKIN & NAILS ADVANCED) TABS Take 1 tablet by mouth daily.  . nitroGLYCERIN (NITROSTAT) 0.4 MG SL tablet Place 1 tablet (0.4 mg total) under the tongue every 5 (five) minutes as needed for chest pain.  . Omega-3 Fatty Acids (FISH OIL) 1200 MG  CAPS Take 1,200 mg by mouth.  . Probiotic Product (PROBIOTIC ADVANCED PO) Take 1 capsule by mouth daily.  . TURMERIC PO Take 1 tablet by mouth daily.     Allergies:   Adhesive [tape], Latex, Sulfonamide derivatives, Nsaids, and Penicillins   Social History   Socioeconomic History  . Marital status: Married    Spouse name: Not on file  . Number of children: 2  . Years of education: 83  . Highest education level: Not on file  Occupational History  . Occupation: Homemaker  Tobacco Use  . Smoking status: Former Smoker    Packs/day: 1.25    Years: 24.00    Pack years: 30.00    Types: Cigarettes     Quit date: 10/12/2001    Years since quitting: 18.6  . Smokeless tobacco: Never Used  Substance and Sexual Activity  . Alcohol use: No  . Drug use: No  . Sexual activity: Yes    Birth control/protection: Surgical    Comment: BTL  Other Topics Concern  . 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   3 cups caffeine daily.   Social Determinants of Health   Financial Resource Strain:   . Difficulty of Paying Living Expenses: Not on file  Food Insecurity:   . Worried About Programme researcher, broadcasting/film/video in the Last Year: Not on file  . Ran Out of Food in the Last Year: Not on file  Transportation Needs:   . Lack of Transportation (Medical): Not on file  . Lack of Transportation (Non-Medical): Not on file  Physical Activity:   . Days of Exercise per Week: Not on file  . Minutes of Exercise per Session: Not on file  Stress:   . Feeling of Stress : Not on file  Social Connections:   . Frequency of Communication with Friends and Family: Not on file  . Frequency of Social Gatherings with Friends and Family: Not on file  . Attends Religious Services: Not on file  . Active Member of Clubs or Organizations: Not on file  . Attends Banker Meetings: Not on file  . Marital Status: Not on file     Family History: The patient's family history includes Coronary artery disease in her father; Diabetes in her mother and sister; Heart disease in her father; Leukemia in her brother; Stroke in her maternal grandmother. There is no history of Breast cancer.  ROS:   Please see the history of present illness.    All other systems reviewed and are negative.  EKGs/Labs/Other Studies Reviewed:    The following studies were reviewed today: Myoview Stress Test: Study Highlights    The left ventricular ejection fraction is hyperdynamic (>65%).  ST segment depression noted at peak stress in V4 V5. There was no  TID or transient ischemic dilitation noted.  Nuclear stress EF: 73%. There were no wall motion abnormalities.  The study is normal.  This is a low risk study. There were no perfusion defect suggestive of ischemia or infarct.   EKG:  EKG is ordered today.  The ekg ordered today demonstrates NSR 71 bpm, within normal limits  Recent Labs: No results found for requested labs within last 8760 hours.  Recent Lipid Panel    Component Value Date/Time   CHOL 216 (H) 09/16/2017 1226   TRIG 103 09/16/2017 1226   HDL 85 09/16/2017 1226   CHOLHDL 2.5  09/16/2017 1226   CHOLHDL 2.3 07/22/2015 1116   VLDL 14 07/22/2015 1116   LDLCALC 110 (H) 09/16/2017 1226   LDLDIRECT 159.4 05/02/2013 1136    Physical Exam:    VS:  BP 110/82   Pulse 71   Ht 5\' 8"  (1.727 m)   Wt 169 lb (76.7 kg)   LMP 11/26/2015   BMI 25.70 kg/m     Wt Readings from Last 3 Encounters:  06/05/20 169 lb (76.7 kg)  06/03/20 166 lb (75.3 kg)  06/12/19 166 lb 3.2 oz (75.4 kg)     GEN:  Well nourished, well developed in no acute distress HEENT: Normal NECK: No JVD; No carotid bruits LYMPHATICS: No lymphadenopathy CARDIAC: RRR, no murmurs, rubs, gallops RESPIRATORY:  Clear to auscultation without rales, wheezing or rhonchi  ABDOMEN: Soft, non-tender, non-distended MUSCULOSKELETAL:  No edema; No deformity  SKIN: Warm and dry NEUROLOGIC:  Alert and oriented x 3 PSYCHIATRIC:  Normal affect   ASSESSMENT:    1. Mixed hyperlipidemia   2. Coronary artery disease involving native coronary artery of native heart without angina pectoris    PLAN:    In order of problems listed above:  1. The patient's lipids are uncontrolled.  She is statin intolerant.  I think she would be an excellent candidate for a PCSK9 inhibitor.  I am going to refer her to the lipid clinic for further treatment.  Her LDL cholesterol is 207 and she is at high risk of future coronary events with her known history. 2. Stable without symptoms of  angina.  Continue aspirin for antiplatelet therapy.  Refer to lipid clinic as outlined above.  Recent Myoview stress test reviewed as above.  Medication Adjustments/Labs and Tests Ordered: Current medicines are reviewed at length with the patient today.  Concerns regarding medicines are outlined above.  Orders Placed This Encounter  Procedures  . AMB Referral to [facility] Pharm[facility]  . EKG 12-Lead   No orders of the defined types were placed in this encounter.   Patient Instructions  Medication Instructions:  Your provider recommends that you continue on your current medications as directed. Please refer to the Current Medication list given to you today.   *If you need a refill on your cardiac medications before your next appointment, please call your pharmacy*   Follow-Up: You have been referred to LIPID CLINIC.  At [facility], you and your health needs are our priority.  As part of our continuing mission to provide you with exceptional heart care, we have created designated Provider Care Teams.  These Care Teams include your primary Cardiologist (physician) and Advanced Practice Providers (APPs -  Physician Assistants and Nurse Practitioners) who all work together to provide you with the care you need, when you need it. Your next appointment:   12 month(s) The format for your next appointment:   In Person Provider:   You may see [facility], MD or one of the following Advanced Practice Providers on your designated Care Team:    Dawn Bollman, PA-C  Tereso Newcomer, Chelsea Aus      Signed, [place], MD  06/05/2020 4:03 PM    [facility]

## 2020-06-10 ENCOUNTER — Ambulatory Visit: Payer: Managed Care, Other (non HMO) | Admitting: Cardiovascular Disease

## 2020-07-05 ENCOUNTER — Other Ambulatory Visit: Payer: Self-pay

## 2020-07-05 ENCOUNTER — Ambulatory Visit (INDEPENDENT_AMBULATORY_CARE_PROVIDER_SITE_OTHER): Payer: Managed Care, Other (non HMO) | Admitting: Pharmacist

## 2020-07-05 DIAGNOSIS — I214 Non-ST elevation (NSTEMI) myocardial infarction: Secondary | ICD-10-CM

## 2020-07-05 DIAGNOSIS — E782 Mixed hyperlipidemia: Secondary | ICD-10-CM | POA: Diagnosis not present

## 2020-07-05 DIAGNOSIS — I2510 Atherosclerotic heart disease of native coronary artery without angina pectoris: Secondary | ICD-10-CM

## 2020-07-05 DIAGNOSIS — T466X5A Adverse effect of antihyperlipidemic and antiarteriosclerotic drugs, initial encounter: Secondary | ICD-10-CM | POA: Diagnosis not present

## 2020-07-05 DIAGNOSIS — E7800 Pure hypercholesterolemia, unspecified: Secondary | ICD-10-CM | POA: Diagnosis not present

## 2020-07-05 MED ORDER — EVOLOCUMAB 140 MG/ML ~~LOC~~ SOSY: 1.0000 mL | PREFILLED_SYRINGE | SUBCUTANEOUS | 1 refills | Status: DC

## 2020-07-05 NOTE — Progress Notes (Signed)
Patient ID: Dawn Washington                 DOB: 1966/05/06                    MRN: [id number]     HPI: Dawn Washington is a 54 y.o. female patient referred to lipid clinic by Dr Excell Seltzer. PMH is significant for MI, HLD, and CAD.  Patient is statin intolerant. Was unable to function at work due to memory problems.  When she discontinued her statin, the problems resolved.  Previously has tried crestor 10mg  crestor 20mg , crestor 40mg , fish oil 1000mg , and krill oil.  Has a strong family history of HLD, both parents passed away from MI.    At last visit with Dr , brought in lab results from exam at work.  Total cholesterol of 300, triglycerides 134, HDL 69, and LDL 207.  Thinks she may have a history of FH.     Current Medications: n/a Intolerances: rosuvastatin (memory loss, muscle pain) Risk Factors: Hx of MI, CAD, family history LDL goal: <70  Diet: Watches diet, avoids saturated fats.  Only uses avocado oil and olive oil  Exercise: walking  Family History: both parents had MI  Past Medical History:  Diagnosis Date  . CAD (coronary artery disease)    NSTEMI in 2011 treated with DES to the LAD;    Cardiac cath 7/20: Mid RCA ectatic, proximal and mid RCA 30-40%, distal RCA 80-90%, left main 20-30%, proximal LAD stent patent with 30-40% after the stent, D1 ostial 50%, pCFX 50%;  Status post DES to the RCA 05/01/11  . Diverticulitis   . Dyslipidemia   . Gastroesophageal reflux disease   . Myocardial infarction (HCC)   . Numbness and tingling in right hand     Current Outpatient Medications on File Prior to Visit  Medication Sig Dispense Refill  . Ascorbic Acid (VITAMIN C) 1000 MG tablet Take 1 tablet by mouth daily.    2012 aspirin 81 MG tablet Take 81 mg by mouth daily.    8/20 CALCIUM-MAGNESIUM-ZINC PO Take 1 tablet by mouth daily.    . Cholecalciferol (VITAMIN D) 2000 units CAPS Take 1 capsule by mouth daily.    . Coenzyme Q10 (COQ10) 100 MG CAPS Take 1 capsule by mouth daily.     . Cyanocobalamin (B-12) 1000 MCG CAPS Take 1 capsule by mouth daily.    . Menaquinone-7 (VITAMIN K2) 100 MCG CAPS Take 100 mcg by mouth daily.    . Multiple Vitamin (MULTIVITAMIN) capsule Take 1 capsule by mouth daily.    . Multiple Vitamins-Minerals (HAIR SKIN & NAILS ADVANCED) TABS Take 1 tablet by mouth daily.    . nitroGLYCERIN (NITROSTAT) 0.4 MG SL tablet Place 1 tablet (0.4 mg total) under the tongue every 5 (five) minutes as needed for chest pain. 25 tablet 2  . Omega-3 Fatty Acids (FISH OIL) 1200 MG CAPS Take 1,200 mg by mouth.    . Probiotic Product (PROBIOTIC ADVANCED PO) Take 1 capsule by mouth daily.    . TURMERIC PO Take 1 tablet by mouth daily.     No current facility-administered medications on file prior to visit.    Allergies  Allergen Reactions  . Adhesive [Tape] Other (See Comments)    Skin Blisters   . Latex Other (See Comments)    Skin Blisters   . Sulfonamide Derivatives Nausea Only  . Nsaids Palpitations    OK to take 81 mg ASA  .  Penicillins Nausea And Vomiting and Rash    Assessment/Plan:  1. Hyperlipidemia - Patient LDL 207 which is above goal of <70.  Follows a heart healthy diet and is physically active.  Is not able to tolerate statin therapy but is willing to try PCKS9i.  Using demo pen, educated patient on all aspects of PCSK9 therapy including storage, site selection, and administration.  Patient was able to demonstrate using demo pen.  Scheduled repeat lipid panel for early December.  Recheck as needed.  Begin Repatha 140mg  SQ q 14 d Recheck Lipid panel 09/13/20  14/3/21, PharmD, BCACP, CDCES [facility] [address] [place], [place] Phone: [phone]; Fax: [phone] 07/05/2020 4:11 PM

## 2020-07-05 NOTE — Patient Instructions (Addendum)
It was nice meeting you today!  We are going to start you today on Repatha, which is a medication you will inject once every 2 weeks  We will recheck your cholesterol in 2-3 months  Please call us with any questions!  Laural Golden, PharmD, BCACP, CDCES [facility] [address] [place], [place] [address] Phone: [phone]; Fax: [phone] 07/05/2020 3:53 PM

## 2020-07-09 ENCOUNTER — Telehealth: Payer: Self-pay | Admitting: Pharmacist

## 2020-07-09 NOTE — Telephone Encounter (Signed)
Patient faxed over lab results.    PA submitted for Repatha.  Key: [id number]

## 2020-07-10 NOTE — Telephone Encounter (Signed)
PA for repatha approved

## 2020-07-12 ENCOUNTER — Other Ambulatory Visit: Payer: Self-pay | Admitting: Obstetrics and Gynecology

## 2020-07-12 DIAGNOSIS — Z1231 Encounter for screening mammogram for malignant neoplasm of breast: Secondary | ICD-10-CM

## 2020-08-30 ENCOUNTER — Other Ambulatory Visit: Payer: Self-pay

## 2020-08-30 ENCOUNTER — Ambulatory Visit
Admission: RE | Admit: 2020-08-30 | Discharge: 2020-08-30 | Disposition: A | Discharge status: Home / Self Care | Payer: Managed Care, Other (non HMO) | Source: Ambulatory Visit | Attending: Obstetrics and Gynecology | Admitting: Obstetrics and Gynecology

## 2020-08-30 DIAGNOSIS — Z1231 Encounter for screening mammogram for malignant neoplasm of breast: Secondary | ICD-10-CM

## 2020-09-13 ENCOUNTER — Other Ambulatory Visit: Payer: Self-pay

## 2020-09-13 ENCOUNTER — Other Ambulatory Visit: Payer: Managed Care, Other (non HMO) | Admitting: *Deleted

## 2020-09-13 ENCOUNTER — Other Ambulatory Visit: Payer: Self-pay | Admitting: Cardiovascular Disease

## 2020-09-16 LAB — LIPID PANEL
Chol/HDL Ratio: 2.9 ratio (ref 0.0–4.4)
Cholesterol, Total: 227 mg/dL — ABNORMAL HIGH (ref 100–199)
HDL: 78 mg/dL (ref >39)
LDL Chol Calc (NIH): 125 mg/dL — ABNORMAL HIGH (ref 0–99)
Triglycerides: 141 mg/dL (ref 0–149)
VLDL Cholesterol Cal: 24 mg/dL (ref 5–40)

## 2020-09-17 ENCOUNTER — Telehealth: Payer: Self-pay | Admitting: Pharmacist

## 2020-09-17 NOTE — Telephone Encounter (Signed)
Spoke with patient over the phone regarding lipid panel.  Had not been scanned into Epic.    09/13/20 since starting Repatha  TC 227 Trigs 147 HDL 78 LDL 125  Last Results:  TC 300 Trigs 134 HDL 69 LDL 207  Patient pleased with results and will continue Repatha 140mg  q 2 weeks

## 2020-12-17 ENCOUNTER — Other Ambulatory Visit: Payer: Self-pay | Admitting: Cardiovascular Disease

## 2020-12-17 DIAGNOSIS — T466X5A Adverse effect of antihyperlipidemic and antiarteriosclerotic drugs, initial encounter: Secondary | ICD-10-CM

## 2020-12-17 DIAGNOSIS — I2510 Atherosclerotic heart disease of native coronary artery without angina pectoris: Secondary | ICD-10-CM

## 2020-12-17 DIAGNOSIS — E782 Mixed hyperlipidemia: Secondary | ICD-10-CM

## 2020-12-17 DIAGNOSIS — I214 Non-ST elevation (NSTEMI) myocardial infarction: Secondary | ICD-10-CM

## 2020-12-17 DIAGNOSIS — E7800 Pure hypercholesterolemia, unspecified: Secondary | ICD-10-CM

## 2021-07-07 ENCOUNTER — Ambulatory Visit: Payer: Managed Care, Other (non HMO) | Admitting: Cardiovascular Disease

## 2021-07-07 ENCOUNTER — Encounter: Payer: Self-pay | Admitting: Cardiovascular Disease

## 2021-07-07 ENCOUNTER — Other Ambulatory Visit: Payer: Self-pay

## 2021-07-07 VITALS — BP 114/70 | HR 68 | Ht 68.0 in | Wt 177.0 lb

## 2021-07-07 DIAGNOSIS — E782 Mixed hyperlipidemia: Secondary | ICD-10-CM

## 2021-07-07 DIAGNOSIS — I25118 Atherosclerotic heart disease of native coronary artery with other forms of angina pectoris: Secondary | ICD-10-CM

## 2021-07-07 NOTE — Patient Instructions (Signed)
Medication Instructions:  No changes today *If you need a refill on your cardiac medications before your next appointment, please call your pharmacy*   Lab Work: none If you have labs (blood work) drawn today and your tests are completely normal, you will receive your results only by: MyChart Message (if you have MyChart) OR A paper copy in the mail If you have any lab test that is abnormal or we need to change your treatment, we will call you to review the results.   Testing/Procedures: none   Follow-Up: At [facility], you and your health needs are our priority.  As part of our continuing mission to provide you with exceptional heart care, we have created designated Provider Care Teams.  These Care Teams include your primary Cardiologist (physician) and Advanced Practice Providers (APPs -  Physician Assistants and Nurse Practitioners) who all work together to provide you with the care you need, when you need it.   Your next appointment:   12 month(s)  The format for your next appointment:   In Person  Provider:   You may see [name], MD or one of the following Advanced Practice Providers on your designated Care Team:   Tereso Newcomer, PA-C Chelsea Aus, [place]   Other Instructions

## 2021-07-07 NOTE — Progress Notes (Signed)
Cardiology Office Note:    Date:  07/08/2021   ID:  Dawn Washington, DOB March 16, 1966, MRN [id number]  PCP:  Marva Panda, NP   [facility] Providers Cardiologist:  [name], MD     Referring MD: Marva Panda, NP   Chief Complaint  Patient presents with   Coronary Artery Disease    History of Present Illness:    Dawn Washington is a 55 y.o. female with a hx of premature coronary artery disease status post two-vessel stenting in 2011 and 2012 when she was treated with stenting of the LAD and right coronary arteries, respectively.  The patient has statin intolerance due to memory problems and inability to function when she is taking these drugs.  She was referred to the lipid clinic and started on evolocumab, but unfortunately this drug has become cost prohibitive now costing her close to $400 per month.  She has been off of medication for some time and brings in recent labs demonstrating a total cholesterol of 309, HDL 67, LDL 213, and triglycerides 155.  Other labs include a creatinine of 0.78 and hemoglobin A1c of 5.6.  The patient has occasional chest discomfort associated with stress.  No shortness of breath.  She has mild left ankle edema at times.  No orthopnea, PND, or heart palpitations.  No other complaints today.  She denies any change in the pattern of her chest discomfort.  Past Medical History:  Diagnosis Date   CAD (coronary artery disease)    NSTEMI in 2011 treated with DES to the LAD;    Cardiac cath 7/20: Mid RCA ectatic, proximal and mid RCA 30-40%, distal RCA 80-90%, left main 20-30%, proximal LAD stent patent with 30-40% after the stent, D1 ostial 50%, pCFX 50%;  Status post DES to the RCA 05/01/11   Diverticulitis    Dyslipidemia    Gastroesophageal reflux disease    Myocardial infarction (HCC)    Numbness and tingling in right hand     Past Surgical History:  Procedure Laterality Date   CARDIAC CATHETERIZATION  05/28/2011;2011;2012    CARPAL TUNNEL RELEASE Right 08/31/2017   Procedure: RIGHT CARPAL TUNNEL RELEASE;  Surgeon: Betha Loa, MD;  Location: [facility];  Service: Orthopedics;  Laterality: Right;   CERVICAL DISCECTOMY     CHOLECYSTECTOMY  1999   MOLE REMOVAL Bilateral    2 moles removed from neck and 1 removed from her arm    RECTOCELE REPAIR N/A 08/08/2013   Procedure: POSTERIOR REPAIR (RECTOCELE);  Surgeon: Tilda Burrow, MD;  Location: [facility];  Service: Gynecology;  Laterality: N/A;   Stents  2011;2012   TUBAL LIGATION  1999   Wisdom Teeth Extractiion Bilateral 1986    Current Medications: Current Meds  Medication Sig   Ascorbic Acid (VITAMIN C) 1000 MG tablet Take 1 tablet by mouth daily.   aspirin 81 MG tablet Take 81 mg by mouth daily.   CALCIUM-MAGNESIUM-ZINC PO Take 1 tablet by mouth daily.   Cholecalciferol (VITAMIN D) 2000 units CAPS Take 1 capsule by mouth daily.   Coenzyme Q10 (COQ10) 100 MG CAPS Take 1 capsule by mouth daily.   Cyanocobalamin (B-12) 1000 MCG CAPS Take 1 capsule by mouth daily.   Menaquinone-7 (VITAMIN K2) 100 MCG CAPS Take 100 mcg by mouth daily.   Multiple Vitamin (MULTIVITAMIN) capsule Take 1 capsule by mouth daily.   Multiple Vitamins-Minerals (HAIR SKIN & NAILS ADVANCED) TABS Take 1 tablet by mouth daily.   nitroGLYCERIN (NITROSTAT) 0.4 MG  SL tablet Place 1 tablet (0.4 mg total) under the tongue every 5 (five) minutes as needed for chest pain.   Omega-3 Fatty Acids (FISH OIL) 1200 MG CAPS Take 1,200 mg by mouth.   Probiotic Product (PROBIOTIC ADVANCED PO) Take 1 capsule by mouth daily.   TURMERIC PO Take 1 tablet by mouth daily.     Allergies:   Adhesive [tape], Latex, Rosuvastatin, Sulfonamide derivatives, Nsaids, and Penicillins   Social History   Socioeconomic History   Marital status: Married    Spouse name: Not on file   Number of children: 2   Years of education: 14   Highest education level: Not on file  Occupational History   Occupation:  Homemaker  Tobacco Use   Smoking status: Former    Packs/day: 1.25    Years: 24.00    Pack years: 30.00    Types: Cigarettes    Quit date: 10/12/2001    Years since quitting: 19.7   Smokeless tobacco: Never  Substance and Sexual Activity   Alcohol use: No   Drug use: No   Sexual activity: Yes    Birth control/protection: Surgical    Comment: BTL  Other Topics Concern   Not on file  Social History Narrative   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   3 cups caffeine daily.   Social Determinants of Health   Financial Resource Strain: Not on file  Food Insecurity: Not on file  Transportation Needs: Not on file  Physical Activity: Not on file  Stress: Not on file  Social Connections: Not on file     Family History: The patient's family history includes Coronary artery disease in her father; Diabetes in her mother and sister; Heart disease in her father; Leukemia in her brother; Stroke in her maternal grandmother. There is no history of Breast cancer.  ROS:   Please see the history of present illness.    All other systems reviewed and are negative.  EKGs/Labs/Other Studies Reviewed:    The following studies were reviewed today: Myoview Stress Test 06/03/2020: Study Highlights    The left ventricular ejection fraction is hyperdynamic (>65%). ST segment depression noted at peak stress in V4 V5. There was no TID or transient ischemic dilitation noted. Nuclear stress EF: 73%. There were no wall motion abnormalities. The study is normal. This is a low risk study. There were no perfusion defect suggestive of ischemia or infarct.  EKG:  EKG is ordered today.  The ekg ordered today demonstrates normal sinus rhythm 68 bpm, nonspecific ST abnormality, no significant change from previous tracing.  Recent Labs: No results found for requested labs within last 8760 hours.  Recent Lipid Panel    Component Value Date/Time   CHOL 227  (H) 09/13/2020 0000   TRIG 141 09/13/2020 0000   HDL 78 09/13/2020 0000   CHOLHDL 2.9 09/13/2020 0000   CHOLHDL 2.3 07/22/2015 1116   VLDL 14 07/22/2015 1116   LDLCALC 125 (H) 09/13/2020 0000   LDLDIRECT 159.4 05/02/2013 1136     Risk Assessment/Calculations:           Physical Exam:    VS:  BP 114/70   Pulse 68   Ht 5\' 8"  (1.727 m)   Wt 177 lb (80.3 kg)   LMP 11/26/2015   SpO2 96%   BMI 26.91 kg/m     Wt Readings from Last 3 Encounters:  07/07/21 177 lb (80.3 kg)  06/05/20 169  lb (76.7 kg)  06/03/20 166 lb (75.3 kg)     GEN:  Well nourished, well developed in no acute distress HEENT: Normal NECK: No JVD; No carotid bruits LYMPHATICS: No lymphadenopathy CARDIAC: RRR, no murmurs, rubs, gallops RESPIRATORY:  Clear to auscultation without rales, wheezing or rhonchi  ABDOMEN: Soft, non-tender, non-distended MUSCULOSKELETAL:  No edema; No deformity  SKIN: Warm and dry NEUROLOGIC:  Alert and oriented x 3 PSYCHIATRIC:  Normal affect   ASSESSMENT:    1. Atherosclerosis of native coronary artery of native heart with stable angina pectoris (HCC)   2. Mixed hyperlipidemia    PLAN:    In order of problems listed above:  Continue aspirin for antiplatelet therapy.  Otherwise with multiple drug intolerances.  Last stress test from 1 year ago reviewed with no ischemia.  No changes in her EKG noted. Lipids as outlined above.  We will reach out to pharmacy to see if she might be a candidate for inclisiran.      Medication Adjustments/Labs and Tests Ordered: Current medicines are reviewed at length with the patient today.  Concerns regarding medicines are outlined above.  Orders Placed This Encounter  Procedures   EKG 12-Lead    No orders of the defined types were placed in this encounter.   Patient Instructions  Medication Instructions:  No changes today *If you need a refill on your cardiac medications before your next appointment, please call your  pharmacy*   Lab Work: none If you have labs (blood work) drawn today and your tests are completely normal, you will receive your results only by: MyChart Message (if you have MyChart) OR A paper copy in the mail If you have any lab test that is abnormal or we need to change your treatment, we will call you to review the results.   Testing/Procedures: none   Follow-Up: At [facility], you and your health needs are our priority.  As part of our continuing mission to provide you with exceptional heart care, we have created designated Provider Care Teams.  These Care Teams include your primary Cardiologist (physician) and Advanced Practice Providers (APPs -  Physician Assistants and Nurse Practitioners) who all work together to provide you with the care you need, when you need it.   Your next appointment:   12 month(s)  The format for your next appointment:   In Person  Provider:   You may see [name], MD or one of the following Advanced Practice Providers on your designated Care Team:   Tereso Newcomer, PA-C Chelsea Aus, [place]   Other Instructions     Signed, [name], MD  07/08/2021 1:33 PM    [facility]

## 2021-07-10 ENCOUNTER — Telehealth: Payer: Self-pay

## 2021-07-10 NOTE — Telephone Encounter (Signed)
Called the pharmacy regarding repatha and they stated that it went throught for less than $5. I also called the pt back and stated that as well and they voiced understanding and gratitude

## 2021-07-10 NOTE — Telephone Encounter (Signed)
Pa for praluent 150 submitted Dawn Washington [place]: [id number] - PA Case ID: [id number]

## 2021-07-10 NOTE — Telephone Encounter (Signed)
-----   Message from Cheree Ditto, [facility] sent at 07/10/2021  9:12 AM EDT ----- Regarding: RE: repatha co pay card Would it be worth it to see if we can get Praluent covered?  ----- Message ----- From: Eather Colas, CMA Sent: 07/10/2021   8:32 AM EDT To: Cheree Ditto, RPH Subject: repatha co pay card                            It only covers 6 months so she will either have to pay it or be placed on something else ----- Message ----- From: Cheree Ditto, [facility] Sent: 07/09/2021   4:54 PM EDT To: Eather Colas, CMA  Can you see what is going on with this?  ----- Message ----- From: [name], MD Sent: 07/07/2021   3:39 PM EDT To: Cheree Ditto, RPH  Veverly Fells can you take a look at her treatment options?  After a few doses of Repatha that cost $5, they increased her price to nearly $400 per month and that it is cost prohibitive.  Thank you

## 2021-08-04 NOTE — Telephone Encounter (Signed)
Pt c/o medication issue:  1. Name of Medication:  REPATHA SURECLICK 140 MG/ML SOAJ  2. How are you currently taking this medication (dosage and times per day)?    3. Are you having a reaction (difficulty breathing--STAT)?   4. What is your medication issue?   Patient is following up regarding PA for Repatha. She states she was told it hasn't been approved and will still cost her $500+. She would like to discuss alternatives.

## 2021-08-04 NOTE — Telephone Encounter (Signed)
Your note from last month shows pt has active PA on file and was using $5 Repatha card, please call pt/pharmacy to see what the issue is

## 2021-08-05 NOTE — Telephone Encounter (Signed)
Called and spoke to pharmacy who stated that the price was $5 so I called the pt and let her know and they voiced understanding.

## 2021-09-09 ENCOUNTER — Encounter: Payer: Self-pay | Admitting: Cardiovascular Disease

## 2021-10-14 ENCOUNTER — Ambulatory Visit (INDEPENDENT_AMBULATORY_CARE_PROVIDER_SITE_OTHER): Payer: Managed Care, Other (non HMO) | Admitting: Adult Health

## 2021-10-14 ENCOUNTER — Other Ambulatory Visit: Payer: Self-pay

## 2021-10-14 ENCOUNTER — Encounter: Payer: Self-pay | Admitting: Adult Health

## 2021-10-14 VITALS — BP 137/86 | HR 87 | Ht 68.0 in | Wt 178.2 lb

## 2021-10-14 DIAGNOSIS — R350 Frequency of micturition: Secondary | ICD-10-CM | POA: Diagnosis not present

## 2021-10-14 DIAGNOSIS — Z1211 Encounter for screening for malignant neoplasm of colon: Secondary | ICD-10-CM | POA: Diagnosis not present

## 2021-10-14 DIAGNOSIS — Z01419 Encounter for gynecological examination (general) (routine) without abnormal findings: Secondary | ICD-10-CM | POA: Diagnosis not present

## 2021-10-14 DIAGNOSIS — Z1231 Encounter for screening mammogram for malignant neoplasm of breast: Secondary | ICD-10-CM | POA: Diagnosis not present

## 2021-10-14 LAB — HEMOCCULT GUIAC POC 1CARD (OFFICE): Fecal Occult Blood, POC: NEGATIVE

## 2021-10-14 LAB — POCT URINALYSIS DIPSTICK OB
Blood, UA: NEGATIVE
Glucose, UA: NEGATIVE
Leukocytes, UA: NEGATIVE
Nitrite, UA: NEGATIVE

## 2021-10-14 NOTE — Progress Notes (Signed)
Patient ID: Dawn Washington, female   DOB: 15-Jun-1966, 56 y.o.   MRN: [id number] History of Present Illness: Dawn Washington is a 56 year old white female, married, PM in for a well woman gyn exam.  Lab Results  Component Value Date   DIAGPAP  10/25/2019    - Negative for intraepithelial lesion or malignancy (NILM)   [place] Negative 10/25/2019   PCP is Genevieve Norlander NP.  Current Medications, Allergies, Past Medical History, Past Surgical History, Family History and Social History were reviewed in [employer] record.     Review of Systems: Patient denies any headaches, hearing loss, fatigue, blurred vision, shortness of breath, chest pain, abdominal pain, problems with bowel movements,  or intercourse. No joint pain or mood swings.  Has had urinary frequency and back pain,did test strip at home +WBCs   Physical Exam:BP 137/86 (BP Location: Right Arm, Patient Position: Sitting, Cuff Size: Normal)    Pulse 87    Ht 5\' 8"  (1.727 m)    Wt 178 lb 3.2 oz (80.8 kg)    LMP 11/26/2015    BMI 27.10 kg/m  urine dipstick negative General:  Well developed, well nourished, no acute distress Skin:  Warm and dry Neck:  Midline trachea, normal thyroid, good ROM, no lymphadenopathy Lungs; Clear to auscultation bilaterally Breast:  No dominant palpable mass, retraction, or nipple discharge Cardiovascular: Regular rate and rhythm Abdomen:  Soft, non tender, no hepatosplenomegaly Pelvic:  External genitalia is normal in appearance, no lesions.  The vagina is normal in appearance. Urethra has no lesions or masses. The cervix is smooth.  Uterus is felt to be normal size, shape, and contour.  No adnexal masses or tenderness noted.Bladder is non tender, no masses felt. Rectal: Good sphincter tone, no polyps, or hemorrhoids felt.  Hemoccult negative. Extremities/musculoskeletal:  No swelling or varicosities noted, no clubbing or cyanosis Psych:  No mood changes, alert and cooperative,seems  happy AA is 1 Fall risk is low Depression screen [facility] 2/9 10/14/2021 10/25/2019  Decreased Interest 0 0  Down, Depressed, Hopeless 0 0  PHQ - 2 Score 0 0  Altered sleeping 1 0  Tired, decreased energy 1 0  Change in appetite 0 0  Feeling bad or failure about yourself  0 0  Trouble concentrating 0 0  Moving slowly or fidgety/restless 0 0  Suicidal thoughts 0 0  PHQ-9 Score 2 0  Difficult doing work/chores - Not difficult at all    GAD 7 : Generalized Anxiety Score 10/14/2021  Nervous, Anxious, on Edge 0  Control/stop worrying 0  Worry too much - different things 0  Trouble relaxing 0  Restless 0  Easily annoyed or irritable 0  Afraid - awful might happen 0  Total GAD 7 Score 0      Upstream - 10/14/21 1420       Pregnancy Intention Screening   Does the patient want to become pregnant in the next year? No    Does the patient's partner want to become pregnant in the next year? No    Would the patient like to discuss contraceptive options today? No      Contraception Wrap Up   Current Method Female Sterilization   post-menopausal   End Method Female Sterilization    Contraception Counseling Provided No            Examination chaperoned by Marcelino Scot RN  Impression and Plan: 1. Encounter for well woman exam with routine gynecological exam Pap and physical  in 1 year Labs with PCP Colonoscopy  per GI   2. Encounter for screening fecal occult blood testing   3. Screening mammogram for breast cancer [facility], she will call for appt  4. Urinary frequency [place] C&S sent

## 2021-10-15 LAB — URINALYSIS, ROUTINE W REFLEX MICROSCOPIC
Bilirubin, UA: NEGATIVE
Glucose, UA: NEGATIVE
Ketones, UA: NEGATIVE
Leukocytes,UA: NEGATIVE
Nitrite, UA: NEGATIVE
Protein,UA: NEGATIVE
RBC, UA: NEGATIVE
Specific Gravity, UA: 1.008 (ref 1.005–1.030)
Urobilinogen, Ur: 0.2 mg/dL (ref 0.2–1.0)
pH, UA: 7.5 (ref 5.0–7.5)

## 2021-10-16 LAB — URINE CULTURE: Organism ID, Bacteria: NO GROWTH

## 2021-11-04 ENCOUNTER — Telehealth: Payer: Self-pay

## 2021-11-04 NOTE — Telephone Encounter (Signed)
Called and spoke to pt and stated that the repatha was approved and that the copay card should've rest and the pt voiced gratitude and understanding

## 2021-11-04 NOTE — Telephone Encounter (Signed)
-----   Message from Olene Floss, RPH-CPP sent at 11/04/2021  8:03 AM EST ----- Please talk to pt. Repatha approved and copay card should have reset

## 2021-11-07 ENCOUNTER — Ambulatory Visit
Admission: RE | Admit: 2021-11-07 | Discharge: 2021-11-07 | Disposition: A | Discharge status: Home / Self Care | Payer: Managed Care, Other (non HMO) | Source: Ambulatory Visit | Attending: Adult Health | Admitting: Adult Health

## 2021-11-07 DIAGNOSIS — Z1231 Encounter for screening mammogram for malignant neoplasm of breast: Secondary | ICD-10-CM

## 2021-11-30 ENCOUNTER — Encounter: Payer: Self-pay | Admitting: Cardiovascular Disease

## 2021-12-01 MED ORDER — REPATHA SURECLICK 140 MG/ML ~~LOC~~ SOAJ: 140.0000 mg | SUBCUTANEOUS | 3 refills | Status: DC

## 2021-12-02 ENCOUNTER — Telehealth: Payer: Self-pay

## 2021-12-02 NOTE — Telephone Encounter (Signed)
Called pt to let them know that the cost of repatha is back down to $5 and they voiced gratitude and understanding.

## 2021-12-28 ENCOUNTER — Other Ambulatory Visit: Payer: Self-pay | Admitting: Cardiovascular Disease

## 2022-01-30 ENCOUNTER — Encounter: Payer: Self-pay | Admitting: Cardiovascular Disease

## 2022-07-13 ENCOUNTER — Ambulatory Visit: Payer: Managed Care, Other (non HMO) | Attending: Cardiovascular Disease | Admitting: Cardiovascular Disease

## 2022-07-13 ENCOUNTER — Encounter: Payer: Self-pay | Admitting: Cardiovascular Disease

## 2022-07-13 VITALS — BP 126/76 | HR 67 | Ht 68.0 in | Wt 171.6 lb

## 2022-07-13 DIAGNOSIS — I25118 Atherosclerotic heart disease of native coronary artery with other forms of angina pectoris: Secondary | ICD-10-CM | POA: Diagnosis not present

## 2022-07-13 DIAGNOSIS — E782 Mixed hyperlipidemia: Secondary | ICD-10-CM

## 2022-07-13 MED ORDER — NITROGLYCERIN 0.4 MG SL SUBL: SUBLINGUAL_TABLET | SUBLINGUAL | 6 refills | Status: AC

## 2022-07-13 NOTE — Patient Instructions (Signed)
Medication Instructions:  Your physician recommends that you continue on your current medications as directed. Please refer to the Current Medication list given to you today. (REFILLED Nitroglycerin) *If you need a refill on your cardiac medications before your next appointment, please call your pharmacy*   Lab Work: NONE If you have labs (blood work) drawn today and your tests are completely normal, you will receive your results only by: [place] (if you have MyChart) OR A paper copy in the mail If you have any lab test that is abnormal or we need to change your treatment, we will call you to review the results.   Testing/Procedures: Referral to Lipid clinic/PharmD (Repatha alternative/management)   Follow-Up: At [facility], you and your health needs are our priority.  As part of our continuing mission to provide you with exceptional heart care, we have created designated Provider Care Teams.  These Care Teams include your primary Cardiologist (physician) and Advanced Practice Providers (APPs -  Physician Assistants and Nurse Practitioners) who all work together to provide you with the care you need, when you need it.  Your next appointment:   1 year(s)  The format for your next appointment:   In Person  Provider:   Sherren Mocha, MD       Important Information About Sugar

## 2022-07-13 NOTE — Progress Notes (Signed)
Cardiology Office Note:    Date:  07/13/2022   ID:  Dawn Washington, DOB 06-06-1966, MRN [id number]  PCP:  Marva Panda, NP   [facility] Providers Cardiologist:  [name], MD     Referring MD: Marva Panda, NP   Chief Complaint  Patient presents with   Coronary Artery Disease    History of Present Illness:    Dawn Washington is a 56 y.o. female with a hx of premature coronary artery disease status post two-vessel stenting in 2011 and 2012 when she was treated with stenting of the LAD and right coronary arteries, respectively.  The patient has statin intolerance due to memory problems and inability to function when she is taking these drugs.  She has had a good response to Repatha but it has again become cost prohibitive for her.  The cost has been in excess of $400.  She did have a $5 co-pay but they are no longer honoring this.  She has been trying to work with continued lifestyle modification, but her most recent lipids from June 06, 2022 show a cholesterol of 291 and an LDL of 197.  Her HDL is 63 and triglycerides are 166.  She reports no recent episodes of chest pain or chest pressure.  However, she has had rare episodes of episodic chest pain over time with no significant change over the last year.  She denies dyspnea, edema, orthopnea, PND, or heart palpitations.  She is compliant with daily aspirin.  Past Medical History:  Diagnosis Date   CAD (coronary artery disease)    NSTEMI in 2011 treated with DES to the LAD;    Cardiac cath 7/20: Mid RCA ectatic, proximal and mid RCA 30-40%, distal RCA 80-90%, left main 20-30%, proximal LAD stent patent with 30-40% after the stent, D1 ostial 50%, pCFX 50%;  Status post DES to the RCA 05/01/11   Diverticulitis    Dyslipidemia    Gastroesophageal reflux disease    Myocardial infarction (HCC)    Numbness and tingling in right hand     Past Surgical History:  Procedure Laterality Date   CARDIAC  CATHETERIZATION  05/28/2011;2011;2012   CARPAL TUNNEL RELEASE Right 08/31/2017   Procedure: RIGHT CARPAL TUNNEL RELEASE;  Surgeon: Betha Loa, MD;  Location: [facility];  Service: Orthopedics;  Laterality: Right;   CERVICAL DISCECTOMY     CHOLECYSTECTOMY  1999   MOLE REMOVAL Bilateral    2 moles removed from neck and 1 removed from her arm    RECTOCELE REPAIR N/A 08/08/2013   Procedure: POSTERIOR REPAIR (RECTOCELE);  Surgeon: Tilda Burrow, MD;  Location: [facility];  Service: Gynecology;  Laterality: N/A;   Stents  2011;2012   TUBAL LIGATION  1999   Wisdom Teeth Extractiion Bilateral 1986    Current Medications: Current Meds  Medication Sig   albuterol (VENTOLIN HFA) 108 (90 Base) MCG/ACT inhaler Inhale 2 puffs into the lungs every 4 (four) hours as needed.   Ascorbic Acid (VITAMIN C) 1000 MG tablet Take 1 tablet by mouth daily.   aspirin 81 MG tablet Take 81 mg by mouth daily.   CALCIUM-MAGNESIUM-ZINC PO Take 1 tablet by mouth daily.   Cholecalciferol (VITAMIN D) 2000 units CAPS Take 1 capsule by mouth daily.   Coenzyme Q10 (COQ10) 100 MG CAPS Take 1 capsule by mouth daily.   Menaquinone-7 (VITAMIN K2) 100 MCG CAPS Take 100 mcg by mouth daily.   Multiple Vitamin (MULTIVITAMIN) capsule Take 1 capsule by mouth  daily.   Multiple Vitamins-Minerals (HAIR SKIN & NAILS ADVANCED) TABS Take 1 tablet by mouth daily.   Omega-3 Fatty Acids (FISH OIL) 1200 MG CAPS Take 1,200 mg by mouth.   Probiotic Product (PROBIOTIC ADVANCED PO) Take 1 capsule by mouth daily.   TURMERIC PO Take 1 tablet by mouth daily.   [DISCONTINUED] nitroGLYCERIN (NITROSTAT) 0.4 MG SL tablet Place 1 tablet (0.4 mg total) under the tongue every 5 (five) minutes as needed for chest pain.     Allergies:   Adhesive [tape], Latex, Rosuvastatin, Sulfonamide derivatives, Nsaids, and Penicillins   Social History   Socioeconomic History   Marital status: Married    Spouse name: Not on file   Number of children:  2   Years of education: 14   Highest education level: Not on file  Occupational History   Occupation: Homemaker  Tobacco Use   Smoking status: Former    Packs/day: 1.25    Years: 24.00    Total pack years: 30.00    Types: Cigarettes    Quit date: 10/12/2001    Years since quitting: 20.7   Smokeless tobacco: Never  Vaping Use   Vaping Use: Never used  Substance and Sexual Activity   Alcohol use: No   Drug use: No   Sexual activity: Not Currently    Birth control/protection: Surgical, Post-menopausal    Comment: BTL  Other Topics Concern   Not on file  Social History Narrative   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   3 cups caffeine daily.   Social Determinants of Health   Financial Resource Strain: Low Risk  (10/14/2021)   Overall Financial Resource Strain (CARDIA)    Difficulty of Paying Living Expenses: Not hard at all  Food Insecurity: No Food Insecurity (10/14/2021)   Hunger Vital Sign    Worried About Running Out of Food in the Last Year: Never true    Ran Out of Food in the Last Year: Never true  Transportation Needs: No Transportation Needs (10/14/2021)   PRAPARE - Administrator, Civil Service (Medical): No    Lack of Transportation (Non-Medical): No  Physical Activity: Insufficiently Active (10/14/2021)   Exercise Vital Sign    Days of Exercise per Week: 2 days    Minutes of Exercise per Session: 20 min  Stress: No Stress Concern Present (10/14/2021)   [employer] of Occupational Health - Occupational Stress Questionnaire    Feeling of Stress : Not at all  Social Connections: Socially Integrated (10/14/2021)   Social Connection and Isolation Panel [NHANES]    Frequency of Communication with Friends and Family: More than three times a week    Frequency of Social Gatherings with Friends and Family: Once a week    Attends Religious Services: More than 4 times per year    Active Member of [employer] or  Organizations: Yes    Attends Engineer, structural: More than 4 times per year    Marital Status: Married     Family History: The patient's family history includes Coronary artery disease in her father; Diabetes in her mother and sister; Heart disease in her father; Leukemia in her brother; Stroke in her maternal grandmother. There is no history of Breast cancer.  ROS:   Please see the history of present illness.    All other systems reviewed and are negative.  EKGs/Labs/Other Studies Reviewed:    EKG:  EKG is ordered today.  The ekg ordered today demonstrates normal sinus rhythm 67 bpm, within normal limits  Recent Labs: No results found for requested labs within last 365 days.  Recent Lipid Panel    Component Value Date/Time   CHOL 227 (H) 09/13/2020 0000   TRIG 141 09/13/2020 0000   HDL 78 09/13/2020 0000   CHOLHDL 2.9 09/13/2020 0000   CHOLHDL 2.3 07/22/2015 1116   VLDL 14 07/22/2015 1116   LDLCALC 125 (H) 09/13/2020 0000   LDLDIRECT 159.4 05/02/2013 1136     Risk Assessment/Calculations:                Physical Exam:    VS:  BP 126/76   Pulse 67   Ht 5\' 8"  (1.727 m)   Wt 171 lb 9.6 oz (77.8 kg)   LMP 11/26/2015   SpO2 96%   BMI 26.09 kg/m     Wt Readings from Last 3 Encounters:  07/13/22 171 lb 9.6 oz (77.8 kg)  10/14/21 178 lb 3.2 oz (80.8 kg)  07/07/21 177 lb (80.3 kg)     GEN:  Well nourished, well developed in no acute distress HEENT: Normal NECK: No JVD; No carotid bruits LYMPHATICS: No lymphadenopathy CARDIAC: RRR, no murmurs, rubs, gallops RESPIRATORY:  Clear to auscultation without rales, wheezing or rhonchi  ABDOMEN: Soft, non-tender, non-distended MUSCULOSKELETAL:  No edema; No deformity  SKIN: Warm and dry NEUROLOGIC:  Alert and oriented x 3 PSYCHIATRIC:  Normal affect   ASSESSMENT:    1. Atherosclerosis of native coronary artery of native heart with stable angina pectoris (HCC)   2. Mixed hyperlipidemia    PLAN:     In order of problems listed above:  Continue aspirin 81 mg daily.  Patient is statin intolerant.  Stress test 2021 reviewed and showed no ischemia.  Symptoms are unchanged.  Nitroglycerin is refilled for as needed use. Need to get the patient back on pharmacotherapy for lipid lowering.  She has marked hyperlipidemia with LDL cholesterol greater than 190 mg/dL.  I am going to refer her back to the lipid clinic to see if there is any way we can get her back on a PCSK9 inhibitor.  If not, we could possibly use inclisiran although the hospital related cost may be prohibitive as well.  Another option could be a clinical trial if she qualifies.           Medication Adjustments/Labs and Tests Ordered: Current medicines are reviewed at length with the patient today.  Concerns regarding medicines are outlined above.  Orders Placed This Encounter  Procedures   AMB Referral to [facility] Pharm-D   EKG 12-Lead   Meds ordered this encounter  Medications   nitroGLYCERIN (NITROSTAT) 0.4 MG SL tablet    Sig: Dissolve 1 tablet under the tongue every 5 minutes as needed for chest pain. Max of 3 doses, then 911.    Dispense:  25 tablet    Refill:  6    Patient Instructions  Medication Instructions:  Your physician recommends that you continue on your current medications as directed. Please refer to the Current Medication list given to you today. (REFILLED Nitroglycerin) *If you need a refill on your cardiac medications before your next appointment, please call your pharmacy*   Lab Work: NONE If you have labs (blood work) drawn today and your tests are completely normal, you will receive your results only by: MyChart Message (if you have MyChart) OR A paper copy in the mail If you have any lab test that  is abnormal or we need to change your treatment, we will call you to review the results.   Testing/Procedures: Referral to Lipid clinic/PharmD (Repatha  alternative/management)   Follow-Up: At [facility], you and your health needs are our priority.  As part of our continuing mission to provide you with exceptional heart care, we have created designated Provider Care Teams.  These Care Teams include your primary Cardiologist (physician) and Advanced Practice Providers (APPs -  Physician Assistants and Nurse Practitioners) who all work together to provide you with the care you need, when you need it.  Your next appointment:   1 year(s)  The format for your next appointment:   In Person  Provider:   Sherren Mocha, MD       Important Information About Sugar         Signed, Sherren Mocha, MD  07/13/2022 11:03 AM    [place]

## 2022-08-21 ENCOUNTER — Ambulatory Visit: Payer: Managed Care, Other (non HMO) | Attending: Interventional Cardiology | Admitting: Student

## 2022-08-21 DIAGNOSIS — E7800 Pure hypercholesterolemia, unspecified: Secondary | ICD-10-CM

## 2022-08-21 MED ORDER — EZETIMIBE 10 MG PO TABS: 10.0000 mg | ORAL_TABLET | Freq: Every day | ORAL | 3 refills | Status: DC

## 2022-08-21 NOTE — Patient Instructions (Signed)
Changes made by your pharmacist Carmela Hurt, PharmD at today's visit:    Instructions/Changes  (what do you need to do) Your Notes  (what you did and when you did it)  1.start taking Ezetimibe 10 mg daily   2. Will work on Armed forces training and education officer PA approved       Praluent is a cholesterol medication that improved your body's ability to get rid of "bad cholesterol" known as LDL. It can lower your LDL up to 60%. It is an injection that is given under the skin every 2 weeks. The most common side effects of Praluent include runny nose, symptoms of the common cold, rarely flu or flu-like symptoms, back/muscle pain in about 3-4% of the patients, and redness, pain, or bruising at the injection site.   If you have any questions or concerns please use My Chart to send questions or call the office at [phone]

## 2022-08-21 NOTE — Progress Notes (Unsigned)
Patient ID: Dawn Washington                 DOB: 1965-10-13                    MRN: [id number]      HPI: Dawn Washington is a 56 y.o. female patient referred to lipid clinic by Dr.Cooper. PMH is significant for CAD, MI, Today patient has no acute concern. Patient was on Repatha but the co pay card was no longer covering the co pay. So it was cost prohibitive for the patient. In the past had tried several statins could not remember the name/dose of all, but she believes she had tried rosuvastatin and atorvastatin and pravastatin.  Patient would like to restart Repatha or any other equivalent medication which would lower the LDLc. Currently only on Fish oil 500 mg daily. Patient eats healthy diet and follows regular exercise regimen.   Current Medications: none fish oil 500 mg daily Intolerances: rosuvastatin 10 mg Lipitor 10 mg pravastatin Risk Factors: CAD, MI, family Hx of premature CVD  LDL goal: <55 mg/dl  Diet: Low carb, rarely fried food,  Breakfast- toast  avocado, boiled eggs  Lunch/ Dinner grilled or baked chicken/ pork, fish, salads, broccoli, brussels sprouts,collard green    Exercise:  15 min brisk walk at work during break  1 hour of regular exercise after work most days of the week   Family History:  Mother- T 2 DM, cholesterol, MI  Father quadrupole bypass at age of 38 and died at age of 76 from MI  Brother- leukemia, thyroid cancer Sister - hypertension, T 2 DM Grandmother- heart valve replaced   Social History:   Labs: LDLc 197 Aug 2023 without Repatha  Lipid Panel     Component Value Date/Time   CHOL 227 (H) 09/13/2020 0000   TRIG 141 09/13/2020 0000   HDL 78 09/13/2020 0000   CHOLHDL 2.9 09/13/2020 0000   CHOLHDL 2.3 07/22/2015 1116   VLDL 14 07/22/2015 1116   LDLCALC 125 (H) 09/13/2020 0000   LDLDIRECT 159.4 05/02/2013 1136   LABVLDL 24 09/13/2020 0000    Past Medical History:  Diagnosis Date   CAD (coronary artery disease)    NSTEMI in 2011  treated with DES to the LAD;    Cardiac cath 7/20: Mid RCA ectatic, proximal and mid RCA 30-40%, distal RCA 80-90%, left main 20-30%, proximal LAD stent patent with 30-40% after the stent, D1 ostial 50%, pCFX 50%;  Status post DES to the RCA 05/01/11   Diverticulitis    Dyslipidemia    Gastroesophageal reflux disease    Myocardial infarction (HCC)    Numbness and tingling in right hand     Current Outpatient Medications on File Prior to Visit  Medication Sig Dispense Refill   albuterol (VENTOLIN HFA) 108 (90 Base) MCG/ACT inhaler Inhale 2 puffs into the lungs every 4 (four) hours as needed.     Ascorbic Acid (VITAMIN C) 1000 MG tablet Take 1 tablet by mouth daily.     aspirin 81 MG tablet Take 81 mg by mouth daily.     CALCIUM-MAGNESIUM-ZINC PO Take 1 tablet by mouth daily.     Cholecalciferol (VITAMIN D) 2000 units CAPS Take 1 capsule by mouth daily.     Coenzyme Q10 (COQ10) 100 MG CAPS Take 1 capsule by mouth daily.     Menaquinone-7 (VITAMIN K2) 100 MCG CAPS Take 100 mcg by mouth daily.     Multiple  Vitamin (MULTIVITAMIN) capsule Take 1 capsule by mouth daily.     Multiple Vitamins-Minerals (HAIR SKIN & NAILS ADVANCED) TABS Take 1 tablet by mouth daily.     nitroGLYCERIN (NITROSTAT) 0.4 MG SL tablet Dissolve 1 tablet under the tongue every 5 minutes as needed for chest pain. Max of 3 doses, then 911. 25 tablet 6   Omega-3 Fatty Acids (FISH OIL) 1200 MG CAPS Take 1,200 mg by mouth.     Probiotic Product (PROBIOTIC ADVANCED PO) Take 1 capsule by mouth daily.     REPATHA SURECLICK 140 MG/ML SOAJ ADMINISTER 1 ML UNDER THE SKIN EVERY 14 DAYS (Patient not taking: Reported on 07/13/2022) 6 mL 3   TURMERIC PO Take 1 tablet by mouth daily.     No current facility-administered medications on file prior to visit.    Allergies  Allergen Reactions   Adhesive [Tape] Other (See Comments)    Skin Blisters    Latex Other (See Comments)    Skin Blisters    Rosuvastatin     MEMORY LOSS    Sulfonamide Derivatives Nausea Only   Nsaids Palpitations    OK to take 81 mg ASA   Penicillins Nausea And Vomiting and Rash    Assessment/Plan:  1. Hyperlipidemia -  HYPERCHOLESTEROLEMIA Assessment:  LDLc : uncontrolled- (197 mg/dl Aug 3143) goal <88 mg/dl Was on Repatha but the co-pay card no longer covering the co-pay so no longer on Repatha Follows regular exercise regimen and eat healthy diet  Tried statins in the past but unable to tolerate well (rosuvastatin 10 mg Lipitor 10 mg pravastatin) Patient is in agreement to make necessary medication changes to optimize LDLc level Until we figure out cost related issue with PCSK9i from insurance we will initiate Zetia 10 mg   Plan  Start Zetia 10 mg daily Will apply for PA for Praluent will keep patient updated during the process Patient has been educated on how to get the co-pay card from Praluent's website and will contact us if any assistance is needed    Thank you,  Carmela Hurt, Pharm.D [facility] A Division of [facility] [address], [place], [place] [address]  Phone: [phone]; Fax: [phone]

## 2022-08-23 NOTE — Assessment & Plan Note (Signed)
Assessment:  LDLc : uncontrolled- (197 mg/dl Aug 4327) goal <61 mg/dl Was on Repatha but the co-pay card no longer covering the co-pay so no longer on Repatha Follows regular exercise regimen and eat healthy diet  Tried statins in the past but unable to tolerate well (rosuvastatin 10 mg Lipitor 10 mg pravastatin) Patient is in agreement to make necessary medication changes to optimize LDLc level Until we figure out cost related issue with PCSK9i from insurance we will initiate Zetia 10 mg   Plan  Start Zetia 10 mg daily Will apply for PA for Praluent will keep patient updated during the process Patient has been educated on how to get the co-pay card from Praluent's website and will contact us if any assistance is needed

## 2022-08-25 ENCOUNTER — Other Ambulatory Visit: Payer: Self-pay | Admitting: Pharmacist

## 2022-08-25 ENCOUNTER — Telehealth: Payer: Self-pay | Admitting: Pharmacist

## 2022-08-25 DIAGNOSIS — E7800 Pure hypercholesterolemia, unspecified: Secondary | ICD-10-CM

## 2022-08-25 NOTE — Addendum Note (Signed)
Addended by: Tylene Fantasia on: 08/25/2022 09:23 AM   Modules accepted: Orders

## 2022-08-25 NOTE — Telephone Encounter (Signed)
Call patient to provide Repatha co-pay card information  BIN: [id number] PCN: [id number] Group: [id number] ID: [id number]

## 2022-11-09 ENCOUNTER — Ambulatory Visit (INDEPENDENT_AMBULATORY_CARE_PROVIDER_SITE_OTHER): Payer: Managed Care, Other (non HMO) | Admitting: Adult Health

## 2022-11-09 ENCOUNTER — Encounter: Payer: Self-pay | Admitting: Adult Health

## 2022-11-09 ENCOUNTER — Other Ambulatory Visit (HOSPITAL_COMMUNITY)
Admission: RE | Admit: 2022-11-09 | Discharge: 2022-11-09 | Disposition: A | Discharge status: Home / Self Care | Payer: Managed Care, Other (non HMO) | Source: Ambulatory Visit | Attending: Adult Health | Admitting: Adult Health

## 2022-11-09 VITALS — BP 131/79 | HR 64 | Ht 68.0 in | Wt 171.0 lb

## 2022-11-09 DIAGNOSIS — R599 Enlarged lymph nodes, unspecified: Secondary | ICD-10-CM | POA: Insufficient documentation

## 2022-11-09 DIAGNOSIS — Z01419 Encounter for gynecological examination (general) (routine) without abnormal findings: Secondary | ICD-10-CM | POA: Insufficient documentation

## 2022-11-09 DIAGNOSIS — Z1231 Encounter for screening mammogram for malignant neoplasm of breast: Secondary | ICD-10-CM

## 2022-11-09 DIAGNOSIS — Z1211 Encounter for screening for malignant neoplasm of colon: Secondary | ICD-10-CM | POA: Diagnosis not present

## 2022-11-09 LAB — HEMOCCULT GUIAC POC 1CARD (OFFICE): Fecal Occult Blood, POC: NEGATIVE

## 2022-11-09 NOTE — Progress Notes (Signed)
Patient ID: Dawn Washington, female   DOB: Jul 01, 1966, 57 y.o.   MRN: [id number] History of Present Illness: Dawn Washington is a 57 year old white female, married, PM in for a well woman gyn exam and pap. She has had the flu,bronchitis and sinus infection this month. She has noticed tenderness right under arm.  PCP is Everardo Beals NP.   Current Medications, Allergies, Past Medical History, Past Surgical History, Family History and Social History were reviewed in [employer] record.     Review of Systems: Patient denies any headaches, hearing loss, fatigue, blurred vision, shortness of breath, chest pain, abdominal pain, problems with bowel movements, urination, or intercourse(not active). No joint pain or mood swings.  Has tenderness right underarm   Physical Exam:BP 131/79 (BP Location: Left Arm, Patient Position: Sitting, Cuff Size: Normal)   Pulse 64   Ht 5\' 8"  (1.727 m)   Wt 171 lb (77.6 kg)   LMP 11/26/2015   BMI 26.00 kg/m   General:  Well developed, well nourished, no acute distress Skin:  Warm and dry Neck:  Midline trachea, normal thyroid, good ROM, no lymphadenopathy Lungs; Clear to auscultation bilaterally Breast:  No dominant palpable mass, retraction, or nipple discharge,has mobile tender,swollen lymph node right underarm Cardiovascular: Regular rate and rhythm Abdomen:  Soft, non tender, no hepatosplenomegaly Pelvic:  External genitalia is normal in appearance, no lesions.  The vagina is pale. Urethra has no lesions or masses. The cervix is smooth, pap with HR HPV genotyping performed.  Uterus is felt to be normal size, shape, and contour.  No adnexal masses or tenderness noted.Bladder is non tender, no masses felt. Rectal: Good sphincter tone, no polyps, or hemorrhoids felt.  Hemoccult negative. Extremities/musculoskeletal:  No swelling or varicosities noted, no clubbing or cyanosis Psych:  No mood changes, alert and cooperative,seems happy AA is  1 Fall risk is low    11/09/2022    3:41 PM 10/14/2021    2:20 PM 10/25/2019    3:46 PM  Depression screen PHQ 2/9  Decreased Interest 0 0 0  Down, Depressed, Hopeless 0 0 0  PHQ - 2 Score 0 0 0  Altered sleeping 0 1 0  Tired, decreased energy 1 1 0  Change in appetite 0 0 0  Feeling bad or failure about yourself  0 0 0  Trouble concentrating 0 0 0  Moving slowly or fidgety/restless 0 0 0  Suicidal thoughts 0 0 0  PHQ-9 Score 1 2 0  Difficult doing work/chores   Not difficult at all       11/09/2022    3:41 PM 10/14/2021    2:20 PM  GAD 7 : Generalized Anxiety Score  Nervous, Anxious, on Edge 0 0  Control/stop worrying 0 0  Worry too much - different things 0 0  Trouble relaxing 0 0  Restless 0 0  Easily annoyed or irritable 0 0  Afraid - awful might happen 0 0  Total GAD 7 Score 0 0      Upstream - 11/09/22 1547       Pregnancy Intention Screening   Does the patient want to become pregnant in the next year? No    Does the patient's partner want to become pregnant in the next year? No    Would the patient like to discuss contraceptive options today? No      Contraception Wrap Up   Current Method Female Sterilization    End Method Female Sterilization  Contraception Counseling Provided No            Examination chaperoned by Levy Pupa LPN   Impression and Plan: 1. Encounter for gynecological examination with Papanicolaou smear of cervix Pap sent Pap in 3 years if normal Physical in 1 year Labs with PCP Colonoscopy per GI - Cytology - PAP( [place])  2. Encounter for screening fecal occult blood testing Hemoccult was negative - POCT occult blood stool  3. Swollen lymph nodes Has been tender/painful about 4 weeks She has had flu,bronchitis and sinus infection Will check labs Will recheck in 2 weeks  - CBC w/Diff  4. Screening mammogram for breast cancer Pt to schedule, but not till after recheck on lymph node - MM 3D SCREEN BREAST BILATERAL;  Future

## 2022-11-10 LAB — CBC WITH DIFFERENTIAL/PLATELET
Basophils Absolute: 0.1 10*3/uL (ref 0.0–0.2)
Basos: 1 %
EOS (ABSOLUTE): 0.1 10*3/uL (ref 0.0–0.4)
Eos: 1 %
Hematocrit: 40.4 % (ref 34.0–46.6)
Hemoglobin: 13.3 g/dL (ref 11.1–15.9)
Immature Grans (Abs): 0 10*3/uL (ref 0.0–0.1)
Immature Granulocytes: 0 %
Lymphocytes Absolute: 3.2 10*3/uL — ABNORMAL HIGH (ref 0.7–3.1)
Lymphs: 36 %
MCH: 29 pg (ref 26.6–33.0)
MCHC: 32.9 g/dL (ref 31.5–35.7)
MCV: 88 fL (ref 79–97)
Monocytes Absolute: 0.7 10*3/uL (ref 0.1–0.9)
Monocytes: 8 %
Neutrophils Absolute: 4.7 10*3/uL (ref 1.4–7.0)
Neutrophils: 54 %
Platelets: 386 10*3/uL (ref 150–450)
RBC: 4.59 x10E6/uL (ref 3.77–5.28)
RDW: 12.2 % (ref 11.7–15.4)
WBC: 8.8 10*3/uL (ref 3.4–10.8)

## 2022-11-13 LAB — CYTOLOGY - PAP
Comment: NEGATIVE
Diagnosis: NEGATIVE
High risk HPV: NEGATIVE

## 2022-11-26 ENCOUNTER — Encounter: Payer: Self-pay | Admitting: Adult Health

## 2022-11-26 ENCOUNTER — Ambulatory Visit: Payer: Managed Care, Other (non HMO) | Admitting: Adult Health

## 2022-11-26 VITALS — BP 141/91 | HR 64 | Ht 68.0 in | Wt 172.5 lb

## 2022-11-26 DIAGNOSIS — N6311 Unspecified lump in the right breast, upper outer quadrant: Secondary | ICD-10-CM | POA: Diagnosis not present

## 2022-11-26 DIAGNOSIS — R599 Enlarged lymph nodes, unspecified: Secondary | ICD-10-CM

## 2022-11-26 NOTE — Progress Notes (Signed)
  Subjective:     Patient ID: Dawn Washington, female   DOB: January 31, 1966, 57 y.o.   MRN: [id number]  HPI Annarose is a 57 year old white female, married, PM in for 2 weeks recheck  of swollen tender lymph node, was seen 11/09/22 she had had flu,bronchitis and sinus infection in January. CBC showed WBC 8.8.  Last pap was negative HPV And NILM 11/09/22.  PCP is Everardo Beals NP  Review of Systems Has had tender lymph node vs mass right under arm Reviewed past medical,surgical, social and family history. Reviewed medications and allergies.     Objective:   Physical Exam Skin warm and dry.   Breast:  No dominant palpable mass, retraction, or nipple discharge, on the left,has  non mobile firm tender,swollen lymph node right underarm, first noted on exam 11/09/22, was more mobile then, no retraction or nipple discharge, so now concerned maybe breast mass, it is at 10 0'clock, 6 FB from areola.   Upstream - 11/26/22 0934       Pregnancy Intention Screening   Does the patient want to become pregnant in the next year? No    Does the patient's partner want to become pregnant in the next year? No    Would the patient like to discuss contraceptive options today? No      Contraception Wrap Up   Current Method Female Sterilization    End Method Female Sterilization    Contraception Counseling Provided No             Assessment:     1. Swollen lymph nodes Firm and tender, still  Diagnostic mammogram and [place] scheduled for 12/15/22 at [facility] at 2:20 pm  - US BREAST LTD UNI RIGHT INC AXILLA; Future - MM DIAG BREAST TOMO BILATERAL; Future - US BREAST LTD UNI LEFT INC AXILLA; Future  2. Mass of upper outer quadrant of right breast ?mass at 10 0'clock 6 FB from areola, under arm,will get diagnostic mammogram to assess  Diagnostic mammogram and [place] scheduled for 12/15/22 at [facility] at 2:20 pm  - US BREAST LTD UNI RIGHT INC AXILLA; Future - MM DIAG BREAST TOMO BILATERAL; Future - US BREAST  LTD UNI LEFT INC AXILLA; Future     Plan:     Follow up prn

## 2022-11-27 ENCOUNTER — Ambulatory Visit: Payer: Managed Care, Other (non HMO)

## 2022-12-15 ENCOUNTER — Ambulatory Visit (HOSPITAL_COMMUNITY)
Admission: RE | Admit: 2022-12-15 | Discharge: 2022-12-15 | Disposition: A | Discharge status: Home / Self Care | Payer: Managed Care, Other (non HMO) | Source: Ambulatory Visit | Attending: Adult Health | Admitting: Adult Health

## 2022-12-15 ENCOUNTER — Encounter (HOSPITAL_COMMUNITY): Payer: Self-pay

## 2022-12-15 DIAGNOSIS — R599 Enlarged lymph nodes, unspecified: Secondary | ICD-10-CM | POA: Diagnosis not present

## 2022-12-15 DIAGNOSIS — N6311 Unspecified lump in the right breast, upper outer quadrant: Secondary | ICD-10-CM | POA: Diagnosis present

## 2023-10-20 ENCOUNTER — Telehealth: Payer: Self-pay | Admitting: Pharmacist

## 2023-10-20 ENCOUNTER — Encounter: Payer: Self-pay | Admitting: Cardiovascular Disease

## 2023-10-20 ENCOUNTER — Ambulatory Visit: Payer: Managed Care, Other (non HMO) | Admitting: Cardiovascular Disease

## 2023-10-20 ENCOUNTER — Ambulatory Visit: Payer: Managed Care, Other (non HMO) | Attending: Cardiovascular Disease | Admitting: Cardiovascular Disease

## 2023-10-20 VITALS — BP 140/100 | HR 79 | Ht 68.0 in | Wt 172.6 lb

## 2023-10-20 DIAGNOSIS — E782 Mixed hyperlipidemia: Secondary | ICD-10-CM

## 2023-10-20 DIAGNOSIS — I25118 Atherosclerotic heart disease of native coronary artery with other forms of angina pectoris: Secondary | ICD-10-CM

## 2023-10-20 DIAGNOSIS — I10 Essential (primary) hypertension: Secondary | ICD-10-CM

## 2023-10-20 DIAGNOSIS — I2510 Atherosclerotic heart disease of native coronary artery without angina pectoris: Secondary | ICD-10-CM

## 2023-10-20 NOTE — Telephone Encounter (Signed)
-----   Message from Nurse Lauraine CROME sent at 10/20/2023  4:49 PM EST ----- Regarding: Repatha  Patient states that she had a repatha  card and was getting it for $10/month, but then was told that it no longer worked. She cannot afford it and has been off of it. Dr Wonda would really like her to be back on it. Can we look into her plan or card to see what we can do to help her? It's much appreciated!

## 2023-10-20 NOTE — Progress Notes (Signed)
 Cardiology Office Note:    Date:  10/22/2023   ID:  Dawn Washington, DOB 05/22/1966, MRN [id number]  PCP:  Cristopher Suzen HERO, NP   [facility] Providers Cardiologist:  Ozell Fell, MD     Referring MD: Cristopher Suzen HERO, NP   Chief Complaint  Patient presents with   Coronary Artery Disease    History of Present Illness:    Dawn Washington is a 58 y.o. female presenting for follow-up of coronary artery disease.  The patient has a history of premature CAD, initially presenting in 2011 in 2012 with ACS requiring stenting of the LAD and right coronary arteries.  She has a long history of statin intolerance with memory problems and marked adverse effects of these drugs.  She was on Repatha  for a period of time but it became cost prohibitive for her.  She is currently not on any lipid-lowering therapy.  The patient reports stable symptoms of mild exertional chest discomfort intermittently.  She has not required sublingual nitroglycerin .  She denies any resting chest pain or pressure and she has had no recent problems.  States that her blood pressure has been controlled at home.  Her blood pressure is elevated today but she has her 12-year-old granddaughter with her who is very fussy during our visit.  Her most recent home blood pressure which she just checked a few days ago was 138/83.   Current Medications: Current Meds  Medication Sig   Ascorbic Acid (VITAMIN C) 1000 MG tablet Take 1 tablet by mouth daily.   aspirin 81 MG tablet Take 81 mg by mouth daily.   betamethasone dipropionate 0.05 % cream Apply topically as needed.   CALCIUM -MAGNESIUM-ZINC PO Take 1 tablet by mouth daily.   Cholecalciferol (VITAMIN D) 2000 units CAPS Take 1 capsule by mouth daily.   clobetasol (TEMOVATE) 0.05 % external solution as needed.   Coenzyme Q10 (COQ10) 100 MG CAPS Take 1 capsule by mouth daily.   Lactobacillus Rhamnosus, GG, (CULTURELLE) CAPS Take 1 capsule by mouth daily.    Menaquinone-7 (VITAMIN K2) 100 MCG CAPS Take 100 mcg by mouth daily.   Multiple Vitamin (MULTIVITAMIN) capsule Take 1 capsule by mouth daily.   Multiple Vitamins-Minerals (HAIR SKIN & NAILS ADVANCED) TABS Take 1 tablet by mouth daily.   nitroGLYCERIN  (NITROSTAT ) 0.4 MG SL tablet Dissolve 1 tablet under the tongue every 5 minutes as needed for chest pain. Max of 3 doses, then 911.   Omega-3 Fatty Acids (FISH OIL) 1200 MG CAPS Take 1,200 mg by mouth.   Probiotic Product (PROBIOTIC ADVANCED PO) Take 1 capsule by mouth daily.   TURMERIC PO Take 1 tablet by mouth daily.   [DISCONTINUED] albuterol (VENTOLIN HFA) 108 (90 Base) MCG/ACT inhaler Inhale 2 puffs into the lungs every 4 (four) hours as needed.     Allergies:   Adhesive [tape], Latex, Rosuvastatin , Sulfonamide derivatives, Nsaids, and Penicillins   ROS:   Please see the history of present illness.    All other systems reviewed and are negative.  EKGs/Labs/Other Studies Reviewed:    The following studies were reviewed today: Cardiac Studies & Procedures     STRESS TESTS  MYOCARDIAL PERFUSION IMAGING 06/03/2020  Narrative  The left ventricular ejection fraction is hyperdynamic (>65%).  ST segment depression noted at peak stress in V4 V5. There was no TID or transient ischemic dilitation noted.  Nuclear stress EF: 73%. There were no wall motion abnormalities.  The study is normal.  This is a low  risk study. There were no perfusion defect suggestive of ischemia or infarct.  Oneil Parchment, MD              EKG:   EKG Interpretation Date/Time:  Wednesday October 20 2023 16:28:57 EST Ventricular Rate:  79 PR Interval:  150 QRS Duration:  80 QT Interval:  396 QTC Calculation: 454 R Axis:   75  Text Interpretation: Normal sinus rhythm Nonspecific ST abnormality When compared with ECG of 02-May-2011 05:29, No significant change was found Confirmed by Wonda Sharper [phone]) on 10/20/2023 4:35:25 PM    Recent Labs: 11/09/2022:  Hemoglobin 13.3; Platelets 386  Recent Lipid Panel    Component Value Date/Time   CHOL 227 (H) 09/13/2020 0000   TRIG 141 09/13/2020 0000   HDL 78 09/13/2020 0000   CHOLHDL 2.9 09/13/2020 0000   CHOLHDL 2.3 07/22/2015 1116   VLDL 14 07/22/2015 1116   LDLCALC 125 (H) 09/13/2020 0000   LDLDIRECT 159.4 05/02/2013 1136     Risk Assessment/Calculations:             Physical Exam:    VS:  BP (!) 140/100   Pulse 79   Ht 5' 8 (1.727 m)   Wt 172 lb 9.6 oz (78.3 kg)   LMP 11/26/2015   SpO2 98%   BMI 26.24 kg/m     Wt Readings from Last 3 Encounters:  10/20/23 172 lb 9.6 oz (78.3 kg)  11/26/22 172 lb 8 oz (78.2 kg)  11/09/22 171 lb (77.6 kg)     GEN:  Well nourished, well developed in no acute distress HEENT: Normal NECK: No JVD; No carotid bruits LYMPHATICS: No lymphadenopathy CARDIAC: RRR, no murmurs, rubs, gallops RESPIRATORY:  Clear to auscultation without rales, wheezing or rhonchi  ABDOMEN: Soft, non-tender, non-distended MUSCULOSKELETAL:  No edema; No deformity  SKIN: Warm and dry NEUROLOGIC:  Alert and oriented x 3 PSYCHIATRIC:  Normal affect   Assessment & Plan Atherosclerosis of native coronary artery of native heart with stable angina pectoris (HCC) The patient is stable on aspirin for antiplatelet therapy.  See below for lipid lowering as we try to get her back on a PCSK9 inhibitor which she has tolerated in the past.  Will continue current management as she reports no change in the severity or frequency of her symptoms. Mixed hyperlipidemia I think it is really important to get her back on lipid-lowering therapy.  She cannot take statins.  We are going to look into whether we can get her coverage for Repatha  again.  I think she would do well on a PCSK9 inhibitor and this is probably the only medication that she can tolerate. Essential hypertension Blood pressure has been controlled based on her home readings.  She is under a lot of stress today.  She will  continue to monitor.     Medication Adjustments/Labs and Tests Ordered: Current medicines are reviewed at length with the patient today.  Concerns regarding medicines are outlined above.  Orders Placed This Encounter  Procedures   EKG 12-Lead   No orders of the defined types were placed in this encounter.   Patient Instructions  Follow-Up: At [facility], you and your health needs are our priority.  As part of our continuing mission to provide you with exceptional heart care, we have created designated Provider Care Teams.  These Care Teams include your primary Cardiologist (physician) and Advanced Practice Providers (APPs -  Physician Assistants and Nurse Practitioners) who all work together to provide you  with the care you need, when you need it.  Your next appointment:   1 year(s)  Provider:   Ozell Fell, MD            Signed, Ozell Fell, MD  10/22/2023 10:55 AM    [facility]

## 2023-10-20 NOTE — Patient Instructions (Signed)
 Follow-Up: At [facility], you and your health needs are our priority.  As part of our continuing mission to provide you with exceptional heart care, we have created designated Provider Care Teams.  These Care Teams include your primary Cardiologist (physician) and Advanced Practice Providers (APPs -  Physician Assistants and Nurse Practitioners) who all work together to provide you with the care you need, when you need it.  Your next appointment:   1 year(s)  Provider:   [name], MD

## 2023-10-20 NOTE — Telephone Encounter (Signed)
 Called patient, preferred pharmacy has not changed from insurance. Co-pay went up from $5/month to $15/month. Patient was told from pharmacy the card is no longer good  Will call pharmacy on Friday Jan 10.

## 2023-10-21 ENCOUNTER — Other Ambulatory Visit (HOSPITAL_COMMUNITY): Payer: Self-pay

## 2023-10-21 ENCOUNTER — Telehealth: Payer: Self-pay | Admitting: Pharmacy Technician

## 2023-10-21 MED ORDER — REPATHA SURECLICK 140 MG/ML ~~LOC~~ SOAJ: 1.0000 mL | SUBCUTANEOUS | 11 refills | Status: AC

## 2023-10-21 NOTE — Telephone Encounter (Signed)
 Pharmacy said cost would be $600. She has deductible. Looks like copay card isnt working. May have needed to get new one after data breach last year. I tried to get one online but its not giving me the info. I will need to call Repatha .  Called Repatha  and got card info. They also emailed to patient. Patient confirms that she got info. I tried to call it into [facility] but was on hold too long. Per pt. [facility] said hold up was insurance. I will have rx team make sure it doesn't need a PA renewal.  BIN [id number] PCN [id number] [id number] ID [id number]

## 2023-10-21 NOTE — Addendum Note (Signed)
 Addended by: Malena Peer D on: 10/21/2023 02:22 PM   Modules accepted: Orders

## 2023-10-21 NOTE — Telephone Encounter (Addendum)
 Pharmacy Patient Advocate Encounter   Received notification from Pt Calls Messages that prior authorization for repatha  is required/requested.   Insurance verification completed.   The patient is insured through [facility] .   Per test claim: PA required; PA submitted to above mentioned insurance via Phone Key/confirmation #/[facility] Status is pending

## 2023-10-22 ENCOUNTER — Encounter: Payer: Self-pay | Admitting: Cardiovascular Disease

## 2023-10-22 NOTE — Assessment & Plan Note (Signed)
 The patient is stable on aspirin for antiplatelet therapy.  See below for lipid lowering as we try to get her back on a PCSK9 inhibitor which she has tolerated in the past.  Will continue current management as she reports no change in the severity or frequency of her symptoms.

## 2023-10-22 NOTE — Telephone Encounter (Signed)
 Pharmacy calling in regards to Prior Auth on Repatha. Please dvise

## 2023-10-26 ENCOUNTER — Other Ambulatory Visit (HOSPITAL_COMMUNITY): Payer: Self-pay

## 2023-10-26 NOTE — Telephone Encounter (Signed)
 Called to check status- pending. Had to fax over extra documentation on statin intolerance

## 2023-10-27 NOTE — Telephone Encounter (Signed)
 Pt made aware of PA approval. Will reach out to us  if she has any problem

## 2023-10-27 NOTE — Telephone Encounter (Signed)
 Pharmacy Patient Advocate Encounter  Received notification from [employer] that Prior Authorization for repatha  has been APPROVED from 10/21/23 to 10/25/24   PA #/Case ID/Reference #: [id number]

## 2023-11-05 ENCOUNTER — Ambulatory Visit: Payer: Managed Care, Other (non HMO) | Admitting: Cardiovascular Disease

## 2023-11-23 IMAGING — MG MM DIGITAL SCREENING BILAT W/ TOMO AND CAD
8 series · 8 of 24 positions shown · non-contrast
Comparison: Previous exam(s).

CLINICAL DATA: Screening.

EXAM:
DIGITAL SCREENING BILATERAL MAMMOGRAM WITH TOMOSYNTHESIS AND CAD
TECHNIQUE: Bilateral screening digital craniocaudal and mediolateral oblique
mammograms were obtained. Bilateral screening digital breast
tomosynthesis was performed. The images were evaluated with
computer-aided detection.

[R MLO synth-2D]
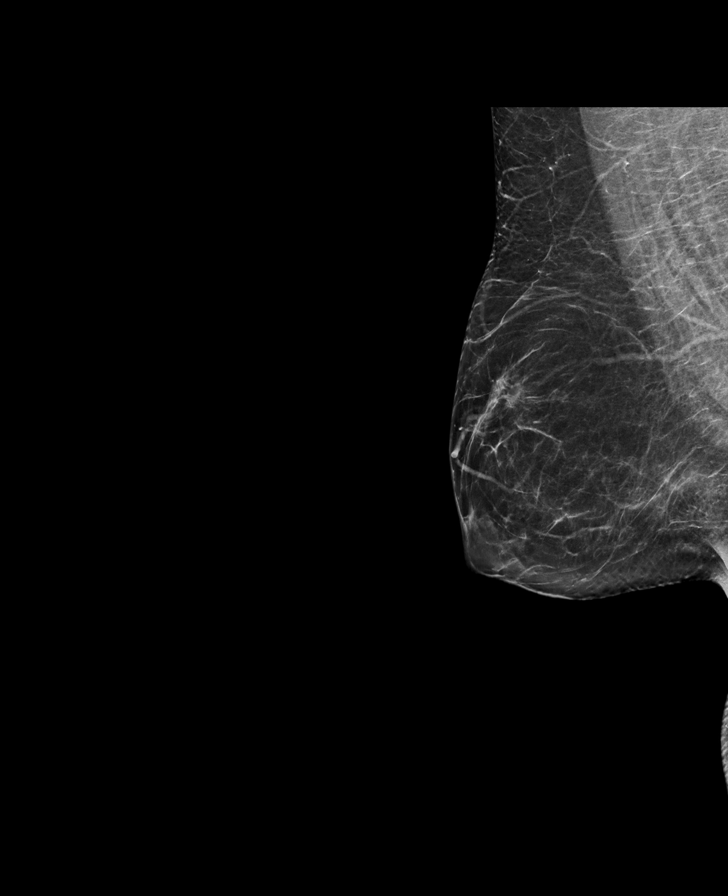

[L CC synth-2D]
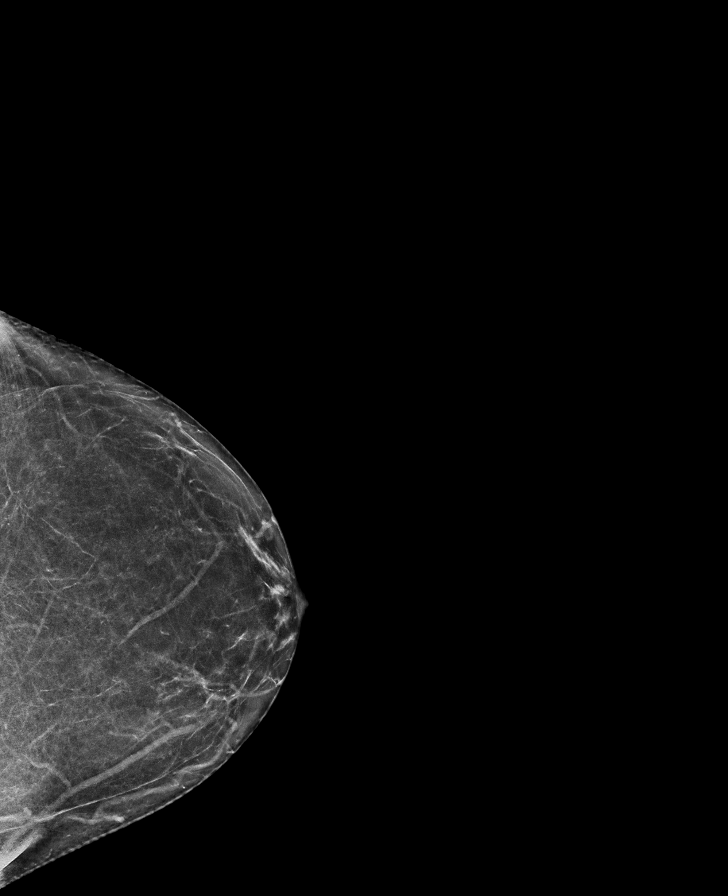

[R CC synth-2D]
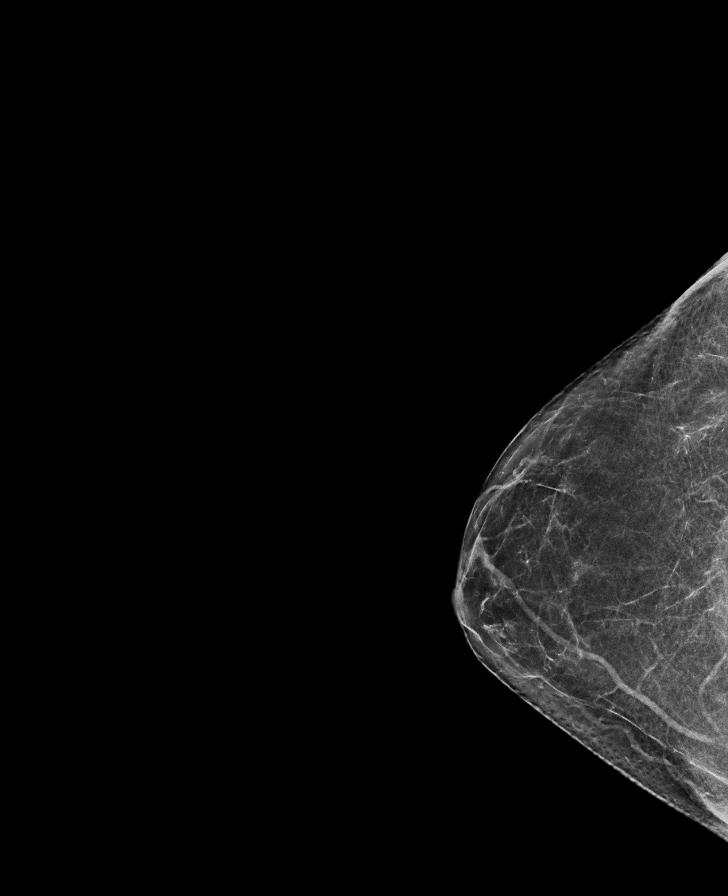

[L MLO synth-2D]
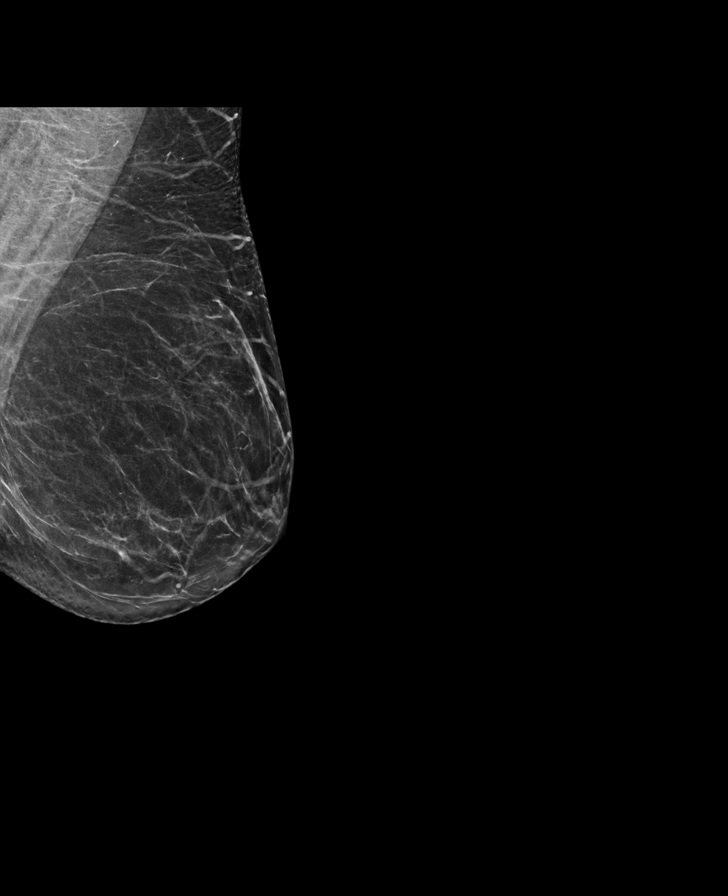

[L MLO tomo · tomo slice 35/69.0]
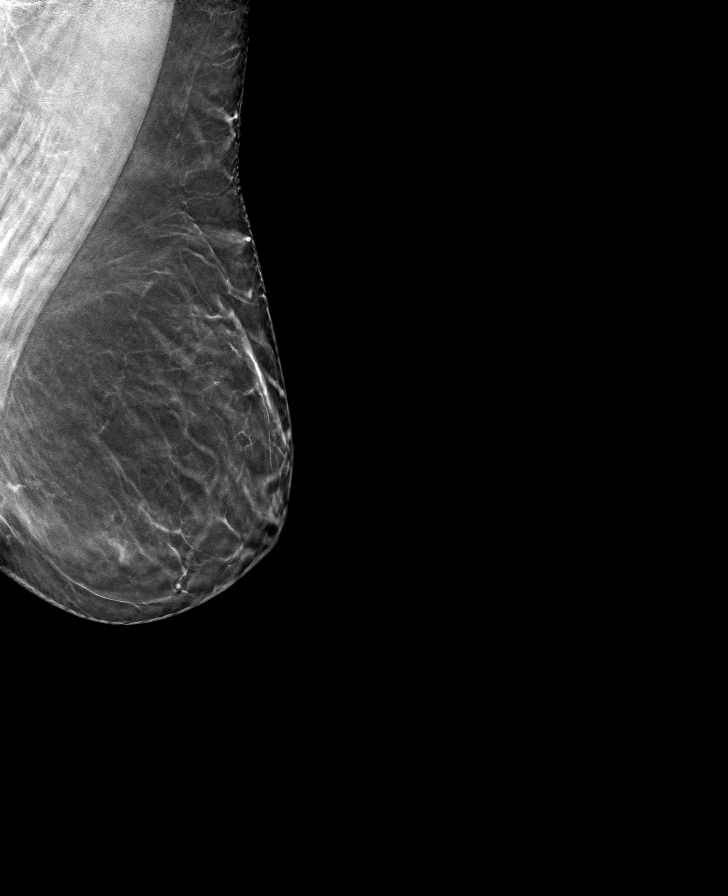

[R MLO tomo · tomo slice 35/68.0]
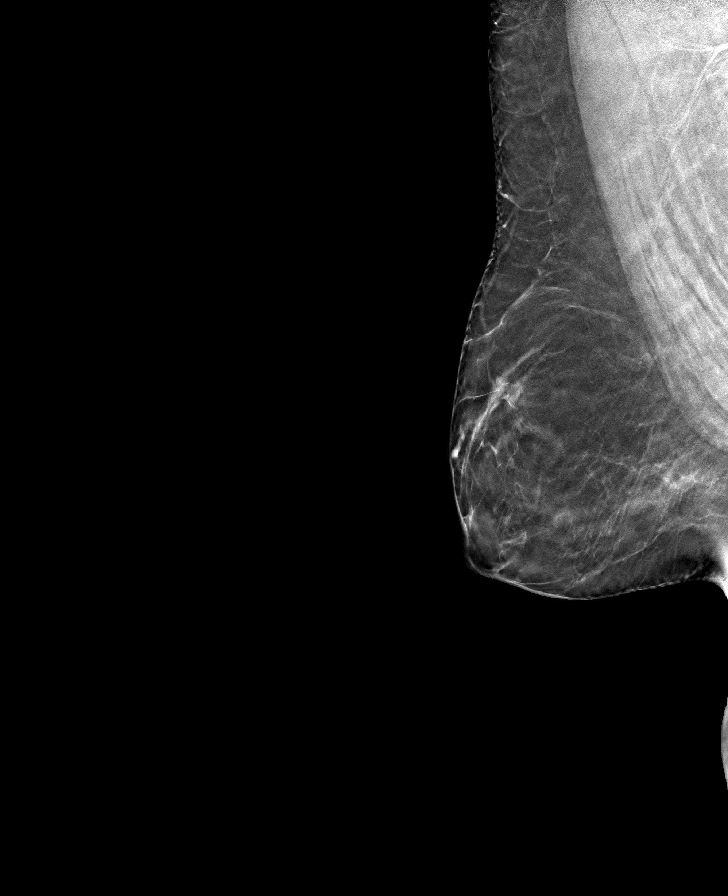

[R CC tomo · tomo slice 31/60.0]
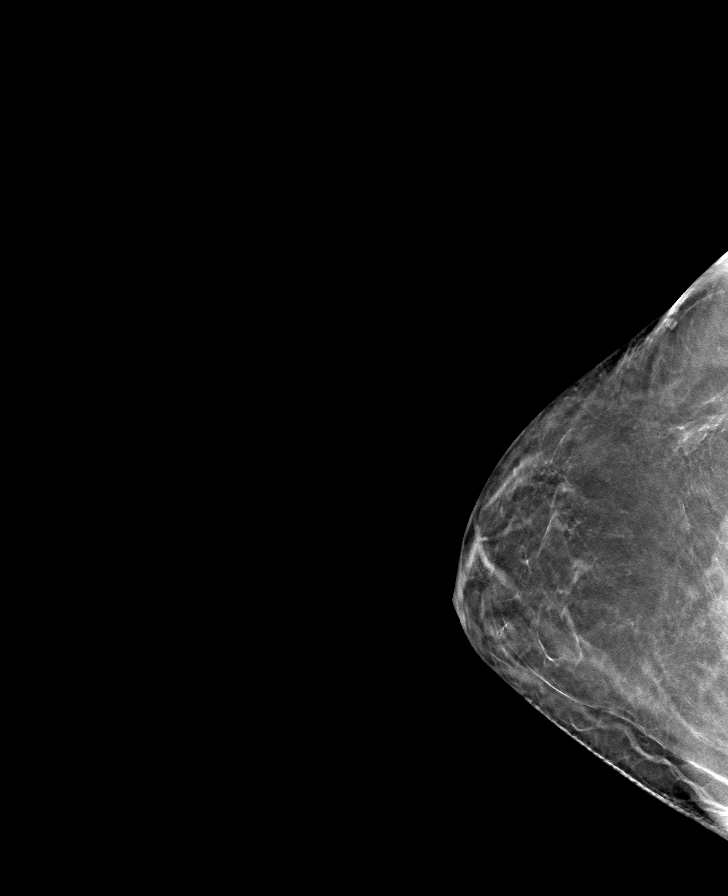

[L CC tomo · tomo slice 31/60.0]
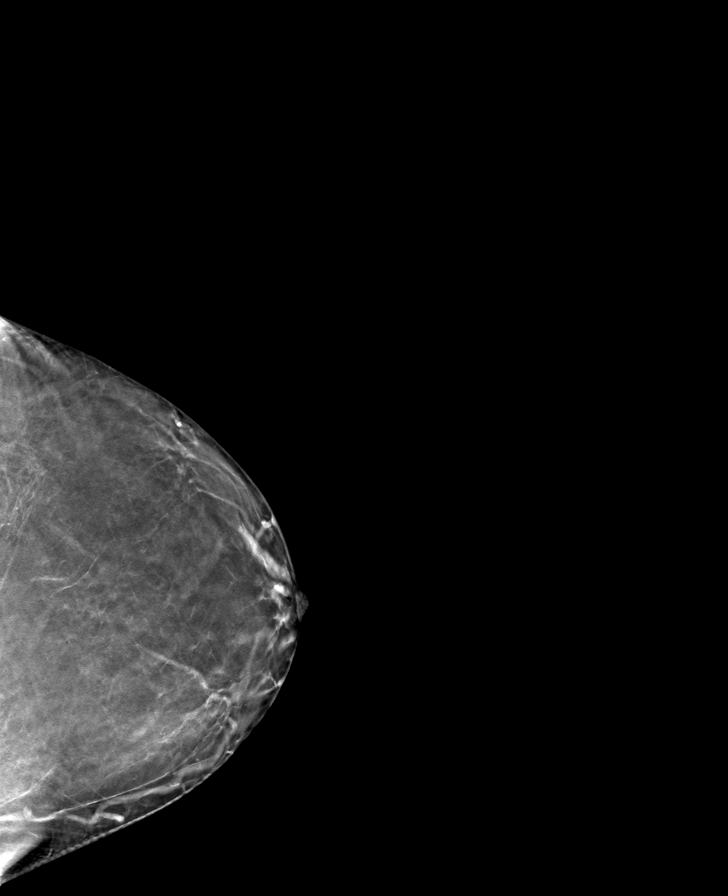

[8 of 24 positions shown; findings below may reference images not displayed]

ACR Breast Density Category b: There are scattered areas of
fibroglandular density.
FINDINGS: There are no findings suspicious for malignancy.
IMPRESSION: No mammographic evidence of malignancy. A result letter of this
screening mammogram will be mailed directly to the patient.

RECOMMENDATION:
Screening mammogram in one year. (Code:51-O-LD2)

BI-RADS CATEGORY  1: Negative.

## 2024-02-03 ENCOUNTER — Encounter: Payer: Self-pay | Admitting: Adult Health

## 2024-02-03 ENCOUNTER — Other Ambulatory Visit: Payer: Self-pay | Admitting: Adult Health

## 2024-02-03 ENCOUNTER — Ambulatory Visit: Admitting: Adult Health

## 2024-02-03 VITALS — BP 155/95 | HR 63 | Ht 68.0 in | Wt 165.5 lb

## 2024-02-03 DIAGNOSIS — Z1331 Encounter for screening for depression: Secondary | ICD-10-CM | POA: Diagnosis not present

## 2024-02-03 DIAGNOSIS — R030 Elevated blood-pressure reading, without diagnosis of hypertension: Secondary | ICD-10-CM | POA: Diagnosis not present

## 2024-02-03 DIAGNOSIS — Z1211 Encounter for screening for malignant neoplasm of colon: Secondary | ICD-10-CM

## 2024-02-03 DIAGNOSIS — Z01419 Encounter for gynecological examination (general) (routine) without abnormal findings: Secondary | ICD-10-CM | POA: Diagnosis not present

## 2024-02-03 DIAGNOSIS — Z1231 Encounter for screening mammogram for malignant neoplasm of breast: Secondary | ICD-10-CM

## 2024-02-03 LAB — HEMOCCULT GUIAC POC 1CARD (OFFICE): Fecal Occult Blood, POC: NEGATIVE

## 2024-02-03 NOTE — Progress Notes (Signed)
 Patient ID: Dawn Washington, female   DOB: [birth year], 58 y.o.   MRN: [id number] History of Present Illness: Dawn Washington is a 58 year old white female, married, PM in for a well woman gyn exam.    Component Value Date/Time   DIAGPAP  11/09/2022 1548    - Negative for intraepithelial lesion or malignancy (NILM)   DIAGPAP  10/25/2019 1554    - Negative for intraepithelial lesion or malignancy (NILM)   HPVHIGH Negative 11/09/2022 1548   HPVHIGH Negative 10/25/2019 1554   ADEQPAP  11/09/2022 1548    Satisfactory for evaluation; transformation zone component PRESENT.   ADEQPAP  10/25/2019 1554    Satisfactory for evaluation; transformation zone component PRESENT.    No current PCP   Current Medications, Allergies, Past Medical History, Past Surgical History, Family History and Social History were reviewed in [employer] record.     Review of Systems: Patient denies any headaches, hearing loss, fatigue, blurred vision, shortness of breath, chest pain, abdominal pain, problems with bowel movements, urination, or intercourse. No joint pain or mood swings.  Denies any vaginal bleeding   Physical Exam:BP (!) 155/95 (BP Location: Left Arm, Patient Position: Sitting, Cuff Size: Normal)   Pulse 63   Ht 5\' 8"  (1.727 m)   Wt 165 lb 8 oz (75.1 kg)   LMP 11/26/2015   BMI 25.16 kg/m   General:  Well developed, well nourished, no acute distress Skin:  Warm and dry Neck:  Midline trachea, normal thyroid, good ROM, no lymphadenopathy Lungs; Clear to auscultation bilaterally Breast:  No dominant palpable mass, retraction, or nipple discharge Cardiovascular: Regular rate and rhythm Abdomen:  Soft, non tender, no hepatosplenomegaly Pelvic:  External genitalia is normal in appearance, no lesions.  The vagina is normal in appearance. Urethra has no lesions or masses. The cervix is smooth.  Uterus is felt to be normal size, shape, and contour.  No adnexal masses or tenderness  noted.Bladder is non tender, no masses felt. Rectal: Good sphincter tone, no polyps, or hemorrhoids felt.  Hemoccult negative. Extremities/musculoskeletal:  No swelling or varicosities noted, no clubbing or cyanosis Psych:  No mood changes, alert and cooperative,seems happy AA is 1 Fall risk is low    02/03/2024   10:49 AM 11/09/2022    3:41 PM 10/14/2021    2:20 PM  Depression screen PHQ 2/9  Decreased Interest 0 0 0  Down, Depressed, Hopeless 0 0 0  PHQ - 2 Score 0 0 0  Altered sleeping 1 0 1  Tired, decreased energy 1 1 1   Change in appetite 0 0 0  Feeling bad or failure about yourself  0 0 0  Trouble concentrating 0 0 0  Moving slowly or fidgety/restless 0 0 0  Suicidal thoughts 0 0 0  PHQ-9 Score 2 1 2        02/03/2024   10:50 AM 11/09/2022    3:41 PM 10/14/2021    2:20 PM  GAD 7 : Generalized Anxiety Score  Nervous, Anxious, on Edge 0 0 0  Control/stop worrying 0 0 0  Worry too much - different things 0 0 0  Trouble relaxing 0 0 0  Restless 0 0 0  Easily annoyed or irritable 0 0 0  Afraid - awful might happen 0 0 0  Total GAD 7 Score 0 0 0    Upstream - 02/03/24 1057       Pregnancy Intention Screening   Does the patient want to become pregnant in  the next year? N/A    Does the patient's partner want to become pregnant in the next year? N/A    Would the patient like to discuss contraceptive options today? N/A      Contraception Wrap Up   Current Method Female Sterilization   PM   End Method Female Sterilization   PM   Contraception Counseling Provided No            Examination chaperoned by Alphonso Aschoff LPN     Impression and plan: 1. Encounter for well woman exam with routine gynecological exam (Primary) Pap [place] [address] Physical in 1 year  Get mammogram  Colonoscopy with GI Stay active   2. Encounter for screening fecal occult blood testing - POCT occult blood stool  3. Elevated BP without diagnosis of hypertension Keep check on BP and follow up with  Dr Arlester Ladd

## 2024-02-09 ENCOUNTER — Ambulatory Visit
Admission: RE | Admit: 2024-02-09 | Discharge: 2024-02-09 | Disposition: A | Discharge status: Home / Self Care | Source: Ambulatory Visit | Attending: Adult Health | Admitting: Adult Health

## 2024-02-09 DIAGNOSIS — Z1231 Encounter for screening mammogram for malignant neoplasm of breast: Secondary | ICD-10-CM

## 2024-05-13 LAB — LAB REPORT - SCANNED: A1c: 5.7

## 2024-06-23 ENCOUNTER — Ambulatory Visit: Admitting: Physician Assistant

## 2024-06-23 VITALS — BP 126/82 | Ht 68.0 in | Wt 164.0 lb

## 2024-06-23 DIAGNOSIS — Z7689 Persons encountering health services in other specified circumstances: Secondary | ICD-10-CM

## 2024-06-23 DIAGNOSIS — R7303 Prediabetes: Secondary | ICD-10-CM

## 2024-06-23 DIAGNOSIS — N6311 Unspecified lump in the right breast, upper outer quadrant: Secondary | ICD-10-CM

## 2024-06-23 DIAGNOSIS — I25118 Atherosclerotic heart disease of native coronary artery with other forms of angina pectoris: Secondary | ICD-10-CM

## 2024-06-23 DIAGNOSIS — E7800 Pure hypercholesterolemia, unspecified: Secondary | ICD-10-CM

## 2024-06-23 MED ORDER — NEXLETOL 180 MG PO TABS: 1.0000 | ORAL_TABLET | Freq: Every day | ORAL | 1 refills | Status: DC

## 2024-06-23 NOTE — Addendum Note (Signed)
 Addended by: MANCIL PFEIFFER on: 06/23/2024 03:06 PM   Modules accepted: Orders

## 2024-06-23 NOTE — Progress Notes (Addendum)
 New Patient Office Visit  Subjective    Patient ID: Dawn Washington, female    DOB: [birth year]  Age: 58 y.o. MRN: [id number]  CC:  Chief Complaint  Patient presents with  . Establish Care    Recheck lump under right arm    HPI Dawn Washington presents to establish care  Discussed the use of AI scribe software for clinical note transcription with the patient, who gave verbal consent to proceed.  History of Present Illness Dawn Washington is a 58 year old female with heart disease who presents with a painful lump in her right armpit.  She has had a painful lump in her right armpit for over a year. An ultrasound last year showed no concerning findings, but she feels the lump has grown and causes daily pain. The lump feels 'thicker' and asymmetrical compared to the other side. There are no visible skin changes or numbness or tingling in her arm.  She has heart disease and high cholesterol, managed with Repatha  since April 2025, with improved cholesterol levels since August 2024. Patient provides recent A1c and lipid panel from 05/2024 for my review. She has had intolerance to statins and Zetia  and uses fish oil for cholesterol management.  Her blood sugar levels are borderline prediabetic, with a family history of diabetes. She is focused on managing her blood sugar through diet and exercise.  She manages psoriasis with clobetasol and betamethasone creams, effective for flare-ups on her scalp and arms. She has a family history of Alzheimer's disease and is concerned about cholesterol levels in relation to this. She quit smoking in 2003, primarily drinks water, is active, cares for her grandchild, and follows a diet of lean meats, vegetables, and fruits. She does not have additional concerns or complaints today.     Outpatient Encounter Medications as of 06/23/2024  Medication Sig  . Bempedoic Acid  (NEXLETOL ) 180 MG TABS Take 1 tablet (180 mg total) by mouth daily.  . Ascorbic Acid  (VITAMIN C) 1000 MG tablet Take 1 tablet by mouth daily.  SABRA aspirin 81 MG tablet Take 81 mg by mouth daily.  . betamethasone dipropionate 0.05 % cream Apply topically as needed.  . Cholecalciferol (VITAMIN D) 2000 units CAPS Take 1 capsule by mouth daily.  . clobetasol (TEMOVATE) 0.05 % external solution as needed.  . Coenzyme Q10 (COQ10) 100 MG CAPS Take 1 capsule by mouth daily.  . Evolocumab  (REPATHA  SURECLICK) 140 MG/ML SOAJ Inject 140 mg into the skin every 14 (fourteen) days.  . Lactobacillus Rhamnosus, GG, (CULTURELLE) CAPS Take 1 capsule by mouth daily.  . Menaquinone-7 (VITAMIN K2) 100 MCG CAPS Take 100 mcg by mouth daily.  . nitroGLYCERIN  (NITROSTAT ) 0.4 MG SL tablet Dissolve 1 tablet under the tongue every 5 minutes as needed for chest pain. Max of 3 doses, then 911.  . Omega-3 Fatty Acids (FISH OIL) 1200 MG CAPS Take 1,200 mg by mouth.  . Probiotic Product (PROBIOTIC ADVANCED PO) Take 1 capsule by mouth daily.  . TURMERIC PO Take 1 tablet by mouth daily.   No facility-administered encounter medications on file as of 06/23/2024.    Past Medical History:  Diagnosis Date  . Allergy 1999  . Arthritis 2018  . CAD (coronary artery disease)    NSTEMI in 2011 treated with DES to the LAD;    Cardiac cath 7/20: Mid RCA ectatic, proximal and mid RCA 30-40%, distal RCA 80-90%, left main 20-30%, proximal LAD stent patent with 30-40% after the stent,  D1 ostial 50%, pCFX 50%;  Status post DES to the RCA 05/01/11  . Diverticulitis   . Dyslipidemia   . Gastroesophageal reflux disease   . Hyperlipidemia   . Myocardial infarction (HCC) 2011  . Numbness and tingling in right hand     Past Surgical History:  Procedure Laterality Date  . CARDIAC CATHETERIZATION  05/28/2011;2011;2012  . CARPAL TUNNEL RELEASE Right 08/31/2017   Procedure: RIGHT CARPAL TUNNEL RELEASE;  Surgeon: Murrell Drivers, MD;  Location: [facility];  Service: Orthopedics;  Laterality: Right;  . CERVICAL  DISCECTOMY    . CHOLECYSTECTOMY  1999  . FRACTURE SURGERY    . JOINT REPLACEMENT  2019  . MOLE REMOVAL Bilateral    2 moles removed from neck and 1 removed from her arm   . RECTOCELE REPAIR N/A 08/08/2013   Procedure: POSTERIOR REPAIR (RECTOCELE);  Surgeon: Norleen LULLA Server, MD;  Location: [facility];  Service: Gynecology;  Laterality: N/A;  . SPINE SURGERY  2019  . Stents  2011;2012  . TUBAL LIGATION  1999  . Wisdom Teeth Extractiion Bilateral 1986    Family History  Problem Relation Age of Onset  . Stroke Maternal Grandmother   . Arthritis Maternal Grandmother   . COPD Maternal Grandmother   . Hearing loss Maternal Grandmother   . Hyperlipidemia Maternal Grandmother   . Hypertension Maternal Grandmother   . Alcohol abuse Maternal Grandfather   . Coronary artery disease Father        Strong family history of premature CAD with her father having bypass before the age of 36  . Heart disease Father   . Diabetes Mother   . Heart attack Mother   . Arthritis Mother   . Heart disease Mother   . Hyperlipidemia Mother   . Miscarriages / [place] Mother   . Vision loss Mother   . Leukemia Brother   . Cancer Brother   . Diabetes Sister   . Hyperlipidemia Sister   . Obesity Sister   . Vision loss Sister   . Varicose Veins Sister   . Diabetes Maternal Uncle   . Obesity Maternal Uncle   . Early death Maternal Uncle   . Early death Maternal Uncle   . Hyperlipidemia Maternal Aunt   . Hypertension Maternal Aunt   . Obesity Maternal Aunt   . Vision loss Maternal Aunt   . Breast cancer Neg Hx     Social History   Socioeconomic History  . Marital status: Married    Spouse name: Not on file  . Number of children: 2  . Years of education: 63  . Highest education level: Associate degree: occupational, Scientist, product/process development, or vocational program  Occupational History  . Occupation: Homemaker  Tobacco Use  . Smoking status: Former    Current packs/day: 0.00    Average packs/day: 1.3  packs/day for 24.9 years (31.2 ttl pk-yrs)    Types: Cigarettes    Start date: 10/12/1977    Quit date: 10/12/2001    Years since quitting: 22.7  . Smokeless tobacco: Never  Vaping Use  . Vaping status: Never Used  Substance and Sexual Activity  . Alcohol use: No  . Drug use: No  . Sexual activity: Not Currently    Birth control/protection: Surgical, Post-menopausal    Comment: BTL  Other Topics Concern  . 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   3 cups  caffeine daily.   Social Drivers of Health   Financial Resource Strain: Low Risk  (06/23/2024)   Overall Financial Resource Strain (CARDIA)   . Difficulty of Paying Living Expenses: Not very hard  Food Insecurity: No Food Insecurity (06/23/2024)   Hunger Vital Sign   . Worried About Programme researcher, broadcasting/film/video in the Last Year: Never true   . Ran Out of Food in the Last Year: Never true  Transportation Needs: No Transportation Needs (06/23/2024)   PRAPARE - Transportation   . Lack of Transportation (Medical): No   . Lack of Transportation (Non-Medical): No  Physical Activity: Insufficiently Active (06/23/2024)   Exercise Vital Sign   . Days of Exercise per Week: 3 days   . Minutes of Exercise per Session: 20 min  Stress: No Stress Concern Present (06/23/2024)   [employer] of Occupational Health - Occupational Stress Questionnaire   . Feeling of Stress: Not at all  Social Connections: Socially Integrated (06/23/2024)   Social Connection and Isolation Panel   . Frequency of Communication with Friends and Family: More than three times a week   . Frequency of Social Gatherings with Friends and Family: Twice a week   . Attends Religious Services: More than 4 times per year   . Active Member of Clubs or Organizations: Yes   . Attends Banker Meetings: More than 4 times per year   . Marital Status: Married  Catering manager Violence:  Not At Risk (02/03/2024)   Humiliation, Afraid, Rape, and Kick questionnaire   . Fear of Current or Ex-Partner: No   . Emotionally Abused: No   . Physically Abused: No   . Sexually Abused: No    Review of Systems  Constitutional:  Negative for chills, fever and malaise/fatigue.  Respiratory:  Negative for cough and shortness of breath.   Cardiovascular:  Negative for chest pain and palpitations.  Neurological:  Negative for dizziness and headaches.  Psychiatric/Behavioral:  Negative for depression. The patient is not nervous/anxious.         Objective    BP 126/82   Ht 5' 8 (1.727 m)   Wt 164 lb (74.4 kg)   LMP 11/26/2015   BMI 24.94 kg/m   Physical Exam Constitutional:      General: She is not in acute distress.    Appearance: Normal appearance. She is normal weight. She is not ill-appearing.  HENT:     Head: Normocephalic and atraumatic.     Mouth/Throat:     Mouth: Mucous membranes are moist.     Pharynx: Oropharynx is clear.  Eyes:     Extraocular Movements: Extraocular movements intact.     Conjunctiva/sclera: Conjunctivae normal.  Cardiovascular:     Rate and Rhythm: Normal rate and regular rhythm.     Heart sounds: Normal heart sounds. No murmur heard. Pulmonary:     Effort: Pulmonary effort is normal.     Breath sounds: Normal breath sounds. No wheezing or rales.  Chest:  Breasts:    Right: Tenderness (right axillary) present. No swelling, bleeding or nipple discharge.  Musculoskeletal:     Right lower leg: No edema.     Left lower leg: No edema.  Skin:    General: Skin is warm and dry.  Neurological:     General: No focal deficit present.     Mental Status: She is alert and oriented to person, place, and time.  Psychiatric:        Mood and Affect: Mood  normal.        Behavior: Behavior normal.        Assessment & Plan:  Encounter to establish care  HYPERCHOLESTEROLEMIA Assessment & Plan: Hypercholesterolemia with LDL at 121 mg/dL.  Intolerant to statins and Zetia . On Repatha  with improved levels. Goal LDL 70 mg/dL due to coronary artery disease.  - Consult pharmacist for additional medication options with Repatha . - Adding Nexlteol daily for further management of LDL.  - Encourage dietary modifications: lean meats, vegetables, fruits. - Advise 30 minutes of exercise five times a week.  Orders: -     Comprehensive metabolic panel with GFR -     AMB Referral [facility] Care Management -     Nexletol ; Take 1 tablet (180 mg total) by mouth daily.  Dispense: 30 tablet; Refill: 1  Mass of upper outer quadrant of right breast Assessment & Plan: Chronic painful mass in the right axilla for over a year. Previous ultrasound non-concerning, but patient states mass feels larger. Differential uncertain; further evaluation needed due to persistent symptoms. - Order right axillary ultrasound. - Consider referral to breast clinic or vascular specialist based on ultrasound findings. - Advise Tylenol  or ibuprofen for pain. - Suggest ice or heat application for relief.   Orders: -     US  AXILLA RIGHT  Prediabetes Assessment & Plan: Prediabetes with slightly elevated blood sugar and A1c of 5.7 on recent labs. Family history of diabetes. Focus on lifestyle modifications to prevent diabetes progression. - Encourage healthy diet and regular exercise. - Monitor blood sugar levels regularly.   Orders: -     Comprehensive metabolic panel with GFR  Atherosclerosis of native coronary artery of native heart with stable angina pectoris [facility]) Assessment & Plan: Coronary artery disease with previous myocardial infarction. Managed with lifestyle changes and cholesterol control. Family history of Alzheimer's influences cholesterol management preferences. - Continue Repatha  for cholesterol management. Reaching out to pharmacy team for further control of LDL.  - Encourage healthy diet and regular exercise. - Continue regular follow up with  cardiology.    Return in about 6 months (around 12/21/2024) for hld .   Charmaine Taequan Stockhausen, PA-C

## 2024-06-23 NOTE — Assessment & Plan Note (Addendum)
 Hypercholesterolemia with LDL at 121 mg/dL. Intolerant to statins and Zetia . On Repatha  with improved levels. Goal LDL 70 mg/dL due to coronary artery disease.  - Consult pharmacist for additional medication options with Repatha . - Adding Nexlteol daily for further management of LDL.  - Encourage dietary modifications: lean meats, vegetables, fruits. - Advise 30 minutes of exercise five times a week.

## 2024-06-23 NOTE — Assessment & Plan Note (Signed)
 Prediabetes with slightly elevated blood sugar and A1c of 5.7 on recent labs. Family history of diabetes. Focus on lifestyle modifications to prevent diabetes progression. - Encourage healthy diet and regular exercise. - Monitor blood sugar levels regularly.

## 2024-06-23 NOTE — Assessment & Plan Note (Signed)
 Chronic painful mass in the right axilla for over a year. Previous ultrasound non-concerning, but patient states mass feels larger. Differential uncertain; further evaluation needed due to persistent symptoms. - Order right axillary ultrasound. - Consider referral to breast clinic or vascular specialist based on ultrasound findings. - Advise Tylenol  or ibuprofen for pain. - Suggest ice or heat application for relief.

## 2024-06-23 NOTE — Assessment & Plan Note (Signed)
 Coronary artery disease with previous myocardial infarction. Managed with lifestyle changes and cholesterol control. Family history of Alzheimer's influences cholesterol management preferences. - Continue Repatha  for cholesterol management. Reaching out to pharmacy team for further control of LDL.  - Encourage healthy diet and regular exercise. - Continue regular follow up with cardiology.

## 2024-06-24 LAB — COMPREHENSIVE METABOLIC PANEL WITH GFR
ALT: 17 IU/L (ref 0–32)
AST: 20 IU/L (ref 0–40)
Albumin: 4.4 g/dL (ref 3.8–4.9)
Alkaline Phosphatase: 91 IU/L (ref 44–121)
BUN/Creatinine Ratio: 12 (ref 9–23)
BUN: 8 mg/dL (ref 6–24)
Bilirubin Total: 0.2 mg/dL (ref 0.0–1.2)
CO2: 26 mmol/L (ref 20–29)
Calcium: 9.5 mg/dL (ref 8.7–10.2)
Chloride: 102 mmol/L (ref 96–106)
Creatinine, Ser: 0.68 mg/dL (ref 0.57–1.00)
Globulin, Total: 2.3 g/dL (ref 1.5–4.5)
Glucose: 75 mg/dL (ref 70–99)
Potassium: 4.1 mmol/L (ref 3.5–5.2)
Sodium: 142 mmol/L (ref 134–144)
Total Protein: 6.7 g/dL (ref 6.0–8.5)
eGFR: 101 mL/min/1.73 (ref >59)

## 2024-06-26 ENCOUNTER — Other Ambulatory Visit (HOSPITAL_COMMUNITY): Payer: Self-pay

## 2024-06-26 ENCOUNTER — Telehealth: Payer: Self-pay | Admitting: Pharmacy Technician

## 2024-06-26 ENCOUNTER — Ambulatory Visit: Payer: Self-pay | Admitting: Physician Assistant

## 2024-06-26 NOTE — Telephone Encounter (Signed)
 Pharmacy Patient Advocate Encounter   Received notification from CoverMyMeds that prior authorization for Nexletol  180MG  tablets is required/requested.   Insurance verification completed.   The patient is insured through [facility] .   Per test claim: PA required; PA submitted to above mentioned insurance via Latent Key/confirmation #/EOC [id number] Status is pending

## 2024-06-26 NOTE — Telephone Encounter (Signed)
 Pharmacy Patient Advocate Encounter  Received notification from [employer] that Prior Authorization for Nexletol  180MG  tablets has been APPROVED from 06/26/2024 to 10/11/2038. Ran test claim, Copay is $10.00. This test claim was processed through [facility]- copay amounts may vary at other pharmacies due to pharmacy/plan contracts, or as the patient moves through the different stages of their insurance plan.   PA #/Case ID/Reference #: [id number]

## 2024-06-30 ENCOUNTER — Telehealth: Payer: Self-pay

## 2024-06-30 NOTE — Progress Notes (Signed)
 Care Guide Pharmacy Note  06/30/2024 Name: Dawn Washington MRN: [id number] DOB: 23-Dec-1965  Referred By: Mancil Pfeiffer, PA-C Reason for referral: Complex Care Management (Outreach to schedule with Pharm d )   Dawn Washington is a 58 y.o. year old female who is a primary care patient of [facility], Pfeiffer, [place].  Dawn Washington was referred to the pharmacist for assistance related to: HLD  Successful contact was made with the patient to discuss pharmacy services.  Patient declines engagement at this time. Contact information was provided to the patient should they wish to reach out for assistance at a later time.  Jeoffrey Buffalo , RMA     [facility]  [facility], [place] [facility] Guide  Direct Dial: [phone]  Website: [email or web address]

## 2024-07-04 ENCOUNTER — Other Ambulatory Visit: Payer: Self-pay | Admitting: Physician Assistant

## 2024-07-04 DIAGNOSIS — N6311 Unspecified lump in the right breast, upper outer quadrant: Secondary | ICD-10-CM

## 2024-07-05 ENCOUNTER — Other Ambulatory Visit (HOSPITAL_COMMUNITY): Payer: Self-pay | Admitting: Physician Assistant

## 2024-07-05 DIAGNOSIS — N63 Unspecified lump in unspecified breast: Secondary | ICD-10-CM

## 2024-07-13 ENCOUNTER — Encounter (HOSPITAL_COMMUNITY): Payer: Self-pay

## 2024-07-13 ENCOUNTER — Ambulatory Visit (HOSPITAL_COMMUNITY)
Admission: RE | Admit: 2024-07-13 | Discharge: 2024-07-13 | Disposition: A | Source: Ambulatory Visit | Attending: Physician Assistant

## 2024-07-13 ENCOUNTER — Ambulatory Visit (HOSPITAL_COMMUNITY)
Admission: RE | Admit: 2024-07-13 | Discharge: 2024-07-13 | Disposition: A | Discharge status: Home / Self Care | Source: Ambulatory Visit | Attending: Physician Assistant | Admitting: Physician Assistant

## 2024-07-13 DIAGNOSIS — N63 Unspecified lump in unspecified breast: Secondary | ICD-10-CM | POA: Diagnosis present

## 2024-07-13 DIAGNOSIS — N6311 Unspecified lump in the right breast, upper outer quadrant: Secondary | ICD-10-CM | POA: Diagnosis present

## 2024-07-14 ENCOUNTER — Other Ambulatory Visit (HOSPITAL_COMMUNITY): Payer: Self-pay | Admitting: Family Medicine

## 2024-07-14 DIAGNOSIS — Z7689 Persons encountering health services in other specified circumstances: Secondary | ICD-10-CM

## 2024-07-14 DIAGNOSIS — I25118 Atherosclerotic heart disease of native coronary artery with other forms of angina pectoris: Secondary | ICD-10-CM

## 2024-07-14 DIAGNOSIS — E7800 Pure hypercholesterolemia, unspecified: Secondary | ICD-10-CM

## 2024-07-14 DIAGNOSIS — R7303 Prediabetes: Secondary | ICD-10-CM

## 2024-07-14 DIAGNOSIS — N6311 Unspecified lump in the right breast, upper outer quadrant: Secondary | ICD-10-CM

## 2024-07-17 ENCOUNTER — Ambulatory Visit: Payer: Self-pay | Admitting: Physician Assistant

## 2024-08-26 ENCOUNTER — Other Ambulatory Visit: Payer: Self-pay | Admitting: Physician Assistant

## 2024-08-26 DIAGNOSIS — E7800 Pure hypercholesterolemia, unspecified: Secondary | ICD-10-CM

## 2024-10-19 ENCOUNTER — Encounter: Payer: Self-pay | Admitting: Cardiovascular Disease

## 2024-12-22 ENCOUNTER — Ambulatory Visit: Admitting: Physician Assistant
